# Patient Record
Sex: Male | Born: 1950 | Race: White | Hispanic: No | Smoking: Former smoker
Health system: Southern US, Community
[De-identification: ages and names within clinical notes are randomized; demographics above are authoritative.]

## PROBLEM LIST (undated history)

## (undated) DIAGNOSIS — N189 Chronic kidney disease, unspecified: Secondary | ICD-10-CM

## (undated) DIAGNOSIS — E119 Type 2 diabetes mellitus without complications: Secondary | ICD-10-CM

## (undated) DIAGNOSIS — I714 Abdominal aortic aneurysm, without rupture: Secondary | ICD-10-CM

## (undated) DIAGNOSIS — I10 Essential (primary) hypertension: Secondary | ICD-10-CM

## (undated) DIAGNOSIS — I350 Nonrheumatic aortic (valve) stenosis: Secondary | ICD-10-CM

## (undated) DIAGNOSIS — J45909 Unspecified asthma, uncomplicated: Secondary | ICD-10-CM

---

## 2004-10-15 DIAGNOSIS — I714 Abdominal aortic aneurysm, without rupture, unspecified: Secondary | ICD-10-CM

## 2004-10-15 DIAGNOSIS — I350 Nonrheumatic aortic (valve) stenosis: Secondary | ICD-10-CM

## 2004-10-15 HISTORY — PX: ABDOMINAL AORTIC ANEURYSM REPAIR: SUR1152

## 2004-10-15 HISTORY — DX: Nonrheumatic aortic (valve) stenosis: I35.0

## 2004-10-15 HISTORY — PX: AORTIC VALVE REPLACEMENT: SHX41

## 2004-10-15 HISTORY — DX: Abdominal aortic aneurysm, without rupture: I71.4

## 2004-10-15 HISTORY — DX: Abdominal aortic aneurysm, without rupture, unspecified: I71.40

## 2004-12-11 ENCOUNTER — Emergency Department (HOSPITAL_COMMUNITY): Admission: EM | Admit: 2004-12-11 | Discharge: 2004-12-11 | Payer: Self-pay | Admitting: Emergency Medicine

## 2004-12-21 ENCOUNTER — Ambulatory Visit: Payer: Self-pay | Admitting: *Deleted

## 2004-12-21 ENCOUNTER — Encounter: Payer: Self-pay | Admitting: *Deleted

## 2004-12-21 ENCOUNTER — Ambulatory Visit: Payer: Self-pay | Admitting: Internal Medicine

## 2004-12-21 ENCOUNTER — Emergency Department (HOSPITAL_COMMUNITY): Admission: EM | Admit: 2004-12-21 | Discharge: 2004-12-21 | Payer: Self-pay | Admitting: Emergency Medicine

## 2006-11-06 ENCOUNTER — Ambulatory Visit (HOSPITAL_COMMUNITY): Admission: RE | Admit: 2006-11-06 | Discharge: 2006-11-06 | Payer: Self-pay | Admitting: Orthopaedic Surgery

## 2011-05-02 ENCOUNTER — Encounter: Payer: Self-pay | Admitting: Family Medicine

## 2012-05-26 ENCOUNTER — Ambulatory Visit (HOSPITAL_COMMUNITY)
Admission: RE | Admit: 2012-05-26 | Discharge: 2012-05-26 | Disposition: A | Discharge status: Home / Self Care | Payer: Medicaid Other | Source: Ambulatory Visit | Attending: Family Medicine | Admitting: Family Medicine

## 2012-05-26 ENCOUNTER — Other Ambulatory Visit (HOSPITAL_COMMUNITY): Payer: Self-pay | Admitting: Family Medicine

## 2012-05-26 DIAGNOSIS — R0602 Shortness of breath: Secondary | ICD-10-CM | POA: Insufficient documentation

## 2012-05-26 DIAGNOSIS — F17200 Nicotine dependence, unspecified, uncomplicated: Secondary | ICD-10-CM

## 2012-05-26 DIAGNOSIS — J449 Chronic obstructive pulmonary disease, unspecified: Secondary | ICD-10-CM | POA: Insufficient documentation

## 2012-05-26 DIAGNOSIS — J4489 Other specified chronic obstructive pulmonary disease: Secondary | ICD-10-CM | POA: Insufficient documentation

## 2012-05-26 DIAGNOSIS — R0600 Dyspnea, unspecified: Secondary | ICD-10-CM

## 2014-12-18 ENCOUNTER — Emergency Department (HOSPITAL_COMMUNITY)
Admission: EM | Admit: 2014-12-18 | Discharge: 2014-12-18 | Disposition: A | Discharge status: Home / Self Care | Payer: Medicaid Other | Attending: Emergency Medicine | Admitting: Emergency Medicine

## 2014-12-18 ENCOUNTER — Encounter (HOSPITAL_COMMUNITY): Payer: Self-pay | Admitting: Emergency Medicine

## 2014-12-18 ENCOUNTER — Emergency Department (HOSPITAL_COMMUNITY): Payer: Medicaid Other

## 2014-12-18 DIAGNOSIS — Z880 Allergy status to penicillin: Secondary | ICD-10-CM | POA: Insufficient documentation

## 2014-12-18 DIAGNOSIS — I2510 Atherosclerotic heart disease of native coronary artery without angina pectoris: Secondary | ICD-10-CM | POA: Diagnosis not present

## 2014-12-18 DIAGNOSIS — M25461 Effusion, right knee: Secondary | ICD-10-CM | POA: Insufficient documentation

## 2014-12-18 DIAGNOSIS — Z9889 Other specified postprocedural states: Secondary | ICD-10-CM | POA: Insufficient documentation

## 2014-12-18 DIAGNOSIS — M25561 Pain in right knee: Secondary | ICD-10-CM | POA: Diagnosis not present

## 2014-12-18 DIAGNOSIS — R0602 Shortness of breath: Secondary | ICD-10-CM | POA: Insufficient documentation

## 2014-12-18 DIAGNOSIS — R52 Pain, unspecified: Secondary | ICD-10-CM

## 2014-12-18 DIAGNOSIS — R2241 Localized swelling, mass and lump, right lower limb: Secondary | ICD-10-CM | POA: Diagnosis present

## 2014-12-18 DIAGNOSIS — I10 Essential (primary) hypertension: Secondary | ICD-10-CM | POA: Diagnosis not present

## 2014-12-18 DIAGNOSIS — Z87891 Personal history of nicotine dependence: Secondary | ICD-10-CM | POA: Insufficient documentation

## 2014-12-18 DIAGNOSIS — E119 Type 2 diabetes mellitus without complications: Secondary | ICD-10-CM | POA: Diagnosis not present

## 2014-12-18 HISTORY — DX: Type 2 diabetes mellitus without complications: E11.9

## 2014-12-18 HISTORY — DX: Essential (primary) hypertension: I10

## 2014-12-18 LAB — CBC WITH DIFFERENTIAL/PLATELET
BASOS ABS: 0 10*3/uL (ref 0.0–0.1)
BASOS PCT: 0 % (ref 0–1)
EOS ABS: 0.3 10*3/uL (ref 0.0–0.7)
Eosinophils Relative: 2 % (ref 0–5)
HCT: 40.9 % (ref 39.0–52.0)
HEMOGLOBIN: 14.1 g/dL (ref 13.0–17.0)
Lymphocytes Relative: 23 % (ref 12–46)
Lymphs Abs: 2.7 10*3/uL (ref 0.7–4.0)
MCH: 31 pg (ref 26.0–34.0)
MCHC: 34.5 g/dL (ref 30.0–36.0)
MCV: 89.9 fL (ref 78.0–100.0)
Monocytes Absolute: 0.9 10*3/uL (ref 0.1–1.0)
Monocytes Relative: 7 % (ref 3–12)
NEUTROS ABS: 7.8 10*3/uL — AB (ref 1.7–7.7)
Neutrophils Relative %: 68 % (ref 43–77)
PLATELETS: 151 10*3/uL (ref 150–400)
RBC: 4.55 MIL/uL (ref 4.22–5.81)
RDW: 13.4 % (ref 11.5–15.5)
WBC: 11.7 10*3/uL — AB (ref 4.0–10.5)

## 2014-12-18 LAB — COMPREHENSIVE METABOLIC PANEL
ALBUMIN: 4 g/dL (ref 3.5–5.2)
ALT: 34 U/L (ref 0–53)
ANION GAP: 7 (ref 5–15)
AST: 31 U/L (ref 0–37)
Alkaline Phosphatase: 69 U/L (ref 39–117)
BILIRUBIN TOTAL: 0.4 mg/dL (ref 0.3–1.2)
BUN: 28 mg/dL — ABNORMAL HIGH (ref 6–23)
CO2: 24 mmol/L (ref 19–32)
Calcium: 9.1 mg/dL (ref 8.4–10.5)
Chloride: 108 mmol/L (ref 96–112)
Creatinine, Ser: 1.73 mg/dL — ABNORMAL HIGH (ref 0.50–1.35)
GFR, EST AFRICAN AMERICAN: 47 mL/min — AB (ref >90)
GFR, EST NON AFRICAN AMERICAN: 40 mL/min — AB (ref >90)
Glucose, Bld: 125 mg/dL — ABNORMAL HIGH (ref 70–99)
POTASSIUM: 3.7 mmol/L (ref 3.5–5.1)
SODIUM: 139 mmol/L (ref 135–145)
Total Protein: 6.5 g/dL (ref 6.0–8.3)

## 2014-12-18 MED ORDER — ONDANSETRON HCL 4 MG/2ML IJ SOLN
4.0000 mg | Freq: Once | INTRAMUSCULAR | Status: AC
  Administered 2014-12-18: 4 mg via INTRAVENOUS
  Filled 2014-12-18: qty 2

## 2014-12-18 MED ORDER — OXYCODONE-ACETAMINOPHEN 5-325 MG PO TABS: 1.0000 | ORAL_TABLET | Freq: Four times a day (QID) | ORAL | Status: AC | PRN

## 2014-12-18 MED ORDER — CELECOXIB 100 MG PO CAPS: 100.0000 mg | ORAL_CAPSULE | Freq: Two times a day (BID) | ORAL | Status: DC

## 2014-12-18 MED ORDER — HYDROMORPHONE HCL 1 MG/ML IJ SOLN
1.0000 mg | Freq: Once | INTRAMUSCULAR | Status: AC
  Administered 2014-12-18: 1 mg via INTRAVENOUS
  Filled 2014-12-18: qty 1

## 2014-12-18 NOTE — Discharge Instructions (Signed)
Follow up with dr. Hilda LiasKeeling this week.

## 2014-12-18 NOTE — ED Provider Notes (Signed)
CSN: [id number]     Arrival date & time 12/18/14  1752 History  This chart was scribed for Vincent Lennert, MD by Tanda Rockers, ED Scribe. This patient was seen in room APA08/APA08 and the patient's care was started at 6:20 PM.    Chief Complaint  Patient presents with  . Leg Swelling  . Shortness of Breath    Patient is a 64 y.o. male presenting with shortness of breath and knee pain. The history is provided by the patient. No language interpreter was used.  Shortness of Breath Associated symptoms: no abdominal pain, no chest pain, no cough, no headaches and no rash   Knee Pain Location:  Knee Injury: no   Knee location:  R knee Pain details:    Quality:  Unable to specify   Radiates to:  Does not radiate   Severity:  Moderate   Onset quality:  Sudden   Timing:  Constant   Progression:  Unchanged Chronicity:  New Associated symptoms: no back pain and no fatigue      HPI Comments: [name] is a 64 y.o. male who presents to the Emergency Department complaining of right knee pain that began earlier today. Pt reports that he was eating dinner at [facility] and when he tried to stand up, he was unable to do so due to the pain. Pt states that there is also increased swelling in his right knee. He denies abdominal pain or any other symptoms. Pt does not mention shortness of breath during examination.   Past Medical History  Diagnosis Date  . Coronary artery disease   . Diabetes mellitus without complication   . Hypertension    Past Surgical History  Procedure Laterality Date  . Cardiac surgery     Family History  Problem Relation Age of Onset  . Rheum arthritis Mother    History  Substance Use Topics  . Smoking status: Former Games developer  . Smokeless tobacco: Not on file  . Alcohol Use: 1.2 oz/week    2 Cans of beer per week    Review of Systems  Constitutional: Negative for appetite change and fatigue.  HENT: Negative for congestion, ear discharge and sinus pressure.    Eyes: Negative for discharge.  Respiratory: Negative for cough.   Cardiovascular: Negative for chest pain.  Gastrointestinal: Negative for abdominal pain and diarrhea.  Genitourinary: Negative for frequency and hematuria.  Musculoskeletal: Positive for arthralgias (Right knee pain. ). Negative for back pain.  Skin: Negative for rash.  Neurological: Negative for seizures and headaches.  Psychiatric/Behavioral: Negative for hallucinations.      Allergies  Penicillins  Home Medications   Prior to Admission medications   Not on File   Triage Vitals: BP 189/102 mmHg  Pulse 90  Temp(Src) 97.9 F (36.6 C) (Oral)  Resp 16  Ht  (1.854 m)  Wt 280 lb (127.007 kg)  BMI 36.95 kg/m2  SpO2 100%   Physical Exam  Constitutional: He is oriented to person, place, and time. He appears well-developed.  HENT:  Head: Normocephalic.  Eyes: Conjunctivae and EOM are normal. No scleral icterus.  Neck: Neck supple. No thyromegaly present.  Cardiovascular: Normal rate and regular rhythm.  Exam reveals no gallop and no friction rub.   No murmur heard. Pulmonary/Chest: No stridor. He has no wheezes. He has no rales. He exhibits no tenderness.  Abdominal: He exhibits no distension. There is no tenderness. There is no rebound.  Musculoskeletal: Normal range of motion. He exhibits tenderness (  Right lateral knee. ). He exhibits no edema.  Lymphadenopathy:    He has no cervical adenopathy.  Neurological: He is oriented to person, place, and time. He exhibits normal muscle tone. Coordination normal.  Skin: No rash noted. No erythema.  Psychiatric: He has a normal mood and affect. His behavior is normal.    ED Course  Procedures (including critical care time)  DIAGNOSTIC STUDIES: Oxygen Saturation is 100% on RA, normal by my interpretation.    COORDINATION OF CARE: 6:26 PM-Discussed treatment plan which includes Right knee X Ray with pt at bedside and pt agreed to plan.   Labs Review Labs  Reviewed  CBC WITH DIFFERENTIAL/PLATELET  COMPREHENSIVE METABOLIC PANEL    Imaging Review No results found.   EKG Interpretation None      MDM   Final diagnoses:  Pain   Knee pain from osteoarthritis.   tx with celebrex and percocet  hospital     Vincent LennertJoseph L Natanel Snavely, MD 12/18/14 2004

## 2014-12-18 NOTE — ED Notes (Signed)
Notice swelling to right leg and SOB today while out eating. .  Having pain right leg, rates 10.  Have not taken any medication today.  History of open heart 2006.

## 2014-12-19 NOTE — ED Provider Notes (Signed)
10:28 AM  Pharmacy called.  Pt's insurance does not cover Celebrex which she was discharged home with yesterday. Given prescription for Mobitz 7.5 mg take twice daily #14 with 0 refills.  Layla MawKristen N Floree Zuniga, DO 12/19/14 1029

## 2015-05-03 ENCOUNTER — Emergency Department (HOSPITAL_COMMUNITY): Payer: Medicaid Other

## 2015-05-03 ENCOUNTER — Encounter (HOSPITAL_COMMUNITY): Payer: Self-pay | Admitting: Emergency Medicine

## 2015-05-03 ENCOUNTER — Inpatient Hospital Stay (HOSPITAL_COMMUNITY)
Admission: EM | Admit: 2015-05-03 | Discharge: 2015-05-14 | DRG: 189 | Disposition: A | Discharge status: Home / Self Care | Payer: Medicaid Other | Attending: Family Medicine | Admitting: Family Medicine

## 2015-05-03 DIAGNOSIS — J9621 Acute and chronic respiratory failure with hypoxia: Principal | ICD-10-CM | POA: Diagnosis present

## 2015-05-03 DIAGNOSIS — N189 Chronic kidney disease, unspecified: Secondary | ICD-10-CM | POA: Diagnosis present

## 2015-05-03 DIAGNOSIS — I10 Essential (primary) hypertension: Secondary | ICD-10-CM

## 2015-05-03 DIAGNOSIS — Z794 Long term (current) use of insulin: Secondary | ICD-10-CM

## 2015-05-03 DIAGNOSIS — Z7982 Long term (current) use of aspirin: Secondary | ICD-10-CM

## 2015-05-03 DIAGNOSIS — Z6841 Body Mass Index (BMI) 40.0 and over, adult: Secondary | ICD-10-CM

## 2015-05-03 DIAGNOSIS — R079 Chest pain, unspecified: Secondary | ICD-10-CM | POA: Diagnosis present

## 2015-05-03 DIAGNOSIS — I129 Hypertensive chronic kidney disease with stage 1 through stage 4 chronic kidney disease, or unspecified chronic kidney disease: Secondary | ICD-10-CM | POA: Diagnosis present

## 2015-05-03 DIAGNOSIS — E119 Type 2 diabetes mellitus without complications: Secondary | ICD-10-CM

## 2015-05-03 DIAGNOSIS — I2510 Atherosclerotic heart disease of native coronary artery without angina pectoris: Secondary | ICD-10-CM | POA: Diagnosis present

## 2015-05-03 DIAGNOSIS — R0600 Dyspnea, unspecified: Secondary | ICD-10-CM | POA: Diagnosis not present

## 2015-05-03 DIAGNOSIS — N182 Chronic kidney disease, stage 2 (mild): Secondary | ICD-10-CM | POA: Diagnosis present

## 2015-05-03 DIAGNOSIS — J449 Chronic obstructive pulmonary disease, unspecified: Secondary | ICD-10-CM | POA: Diagnosis present

## 2015-05-03 DIAGNOSIS — J9601 Acute respiratory failure with hypoxia: Secondary | ICD-10-CM | POA: Diagnosis present

## 2015-05-03 DIAGNOSIS — R0602 Shortness of breath: Secondary | ICD-10-CM

## 2015-05-03 DIAGNOSIS — E669 Obesity, unspecified: Secondary | ICD-10-CM | POA: Diagnosis present

## 2015-05-03 DIAGNOSIS — Z87891 Personal history of nicotine dependence: Secondary | ICD-10-CM | POA: Diagnosis not present

## 2015-05-03 DIAGNOSIS — T380X5A Adverse effect of glucocorticoids and synthetic analogues, initial encounter: Secondary | ICD-10-CM | POA: Diagnosis not present

## 2015-05-03 DIAGNOSIS — Z951 Presence of aortocoronary bypass graft: Secondary | ICD-10-CM | POA: Diagnosis not present

## 2015-05-03 DIAGNOSIS — R000 Tachycardia, unspecified: Secondary | ICD-10-CM | POA: Diagnosis present

## 2015-05-03 DIAGNOSIS — Z952 Presence of prosthetic heart valve: Secondary | ICD-10-CM | POA: Diagnosis not present

## 2015-05-03 DIAGNOSIS — I4892 Unspecified atrial flutter: Secondary | ICD-10-CM | POA: Diagnosis not present

## 2015-05-03 DIAGNOSIS — I483 Typical atrial flutter: Secondary | ICD-10-CM | POA: Diagnosis not present

## 2015-05-03 DIAGNOSIS — J441 Chronic obstructive pulmonary disease with (acute) exacerbation: Secondary | ICD-10-CM

## 2015-05-03 DIAGNOSIS — E1122 Type 2 diabetes mellitus with diabetic chronic kidney disease: Secondary | ICD-10-CM | POA: Diagnosis present

## 2015-05-03 DIAGNOSIS — J9600 Acute respiratory failure, unspecified whether with hypoxia or hypercapnia: Secondary | ICD-10-CM | POA: Diagnosis present

## 2015-05-03 HISTORY — DX: Abdominal aortic aneurysm, without rupture: I71.4

## 2015-05-03 HISTORY — DX: Unspecified asthma, uncomplicated: J45.909

## 2015-05-03 HISTORY — DX: Nonrheumatic aortic (valve) stenosis: I35.0

## 2015-05-03 HISTORY — DX: Chronic kidney disease, unspecified: N18.9

## 2015-05-03 LAB — COMPREHENSIVE METABOLIC PANEL
ALBUMIN: 3.9 g/dL (ref 3.5–5.0)
ALT: 35 U/L (ref 17–63)
AST: 44 U/L — AB (ref 15–41)
Alkaline Phosphatase: 63 U/L (ref 38–126)
Anion gap: 10 (ref 5–15)
BUN: 31 mg/dL — ABNORMAL HIGH (ref 6–20)
CO2: 25 mmol/L (ref 22–32)
Calcium: 9 mg/dL (ref 8.9–10.3)
Chloride: 108 mmol/L (ref 101–111)
Creatinine, Ser: 1.77 mg/dL — ABNORMAL HIGH (ref 0.61–1.24)
GFR calc non Af Amer: 39 mL/min — ABNORMAL LOW (ref >60)
GFR, EST AFRICAN AMERICAN: 45 mL/min — AB (ref >60)
GLUCOSE: 250 mg/dL — AB (ref 65–99)
Potassium: 4.5 mmol/L (ref 3.5–5.1)
Sodium: 143 mmol/L (ref 135–145)
Total Bilirubin: 0.3 mg/dL (ref 0.3–1.2)
Total Protein: 6.4 g/dL — ABNORMAL LOW (ref 6.5–8.1)

## 2015-05-03 LAB — CBC WITH DIFFERENTIAL/PLATELET
Basophils Absolute: 0 10*3/uL (ref 0.0–0.1)
Basophils Relative: 0 % (ref 0–1)
EOS ABS: 0.1 10*3/uL (ref 0.0–0.7)
Eosinophils Relative: 1 % (ref 0–5)
HEMATOCRIT: 42.2 % (ref 39.0–52.0)
HEMOGLOBIN: 14.2 g/dL (ref 13.0–17.0)
Lymphocytes Relative: 18 % (ref 12–46)
Lymphs Abs: 2.2 10*3/uL (ref 0.7–4.0)
MCH: 30.6 pg (ref 26.0–34.0)
MCHC: 33.6 g/dL (ref 30.0–36.0)
MCV: 90.9 fL (ref 78.0–100.0)
MONOS PCT: 6 % (ref 3–12)
Monocytes Absolute: 0.7 10*3/uL (ref 0.1–1.0)
Neutro Abs: 8.8 10*3/uL — ABNORMAL HIGH (ref 1.7–7.7)
Neutrophils Relative %: 75 % (ref 43–77)
PLATELETS: 154 10*3/uL (ref 150–400)
RBC: 4.64 MIL/uL (ref 4.22–5.81)
RDW: 13.7 % (ref 11.5–15.5)
WBC: 11.8 10*3/uL — ABNORMAL HIGH (ref 4.0–10.5)

## 2015-05-03 LAB — TSH: TSH: 1.701 u[IU]/mL (ref 0.350–4.500)

## 2015-05-03 LAB — I-STAT TROPONIN, ED: Troponin i, poc: 0.04 ng/mL (ref 0.00–0.08)

## 2015-05-03 LAB — GLUCOSE, CAPILLARY: Glucose-Capillary: 229 mg/dL — ABNORMAL HIGH (ref 65–99)

## 2015-05-03 LAB — BRAIN NATRIURETIC PEPTIDE: B Natriuretic Peptide: 220 pg/mL — ABNORMAL HIGH (ref 0.0–100.0)

## 2015-05-03 LAB — TROPONIN I: TROPONIN I: 0.05 ng/mL — AB (ref <0.031)

## 2015-05-03 MED ORDER — LEVOFLOXACIN IN D5W 500 MG/100ML IV SOLN
500.0000 mg | INTRAVENOUS | Status: DC
  Administered 2015-05-04: 500 mg via INTRAVENOUS
  Filled 2015-05-03: qty 100

## 2015-05-03 MED ORDER — SODIUM CHLORIDE 0.9 % IJ SOLN
3.0000 mL | INTRAMUSCULAR | Status: DC | PRN
  Administered 2015-05-08: 3 mL via INTRAVENOUS
  Filled 2015-05-03: qty 3

## 2015-05-03 MED ORDER — METHYLPREDNISOLONE SODIUM SUCC 125 MG IJ SOLR
125.0000 mg | Freq: Once | INTRAMUSCULAR | Status: AC
  Administered 2015-05-03: 125 mg via INTRAVENOUS
  Filled 2015-05-03: qty 2

## 2015-05-03 MED ORDER — INSULIN ASPART 100 UNIT/ML ~~LOC~~ SOLN
0.0000 [IU] | Freq: Three times a day (TID) | SUBCUTANEOUS | Status: DC
Start: 2015-05-03 — End: 2015-05-05
  Administered 2015-05-03: 5 [IU] via SUBCUTANEOUS
  Administered 2015-05-04 (×2): 11 [IU] via SUBCUTANEOUS
  Administered 2015-05-04: 15 [IU] via SUBCUTANEOUS
  Administered 2015-05-05: 8 [IU] via SUBCUTANEOUS

## 2015-05-03 MED ORDER — ACETAMINOPHEN 325 MG PO TABS: 650.0000 mg | ORAL_TABLET | Freq: Four times a day (QID) | ORAL | Status: DC | PRN

## 2015-05-03 MED ORDER — ASPIRIN EC 325 MG PO TBEC
325.0000 mg | DELAYED_RELEASE_TABLET | Freq: Every day | ORAL | Status: DC
  Administered 2015-05-03 – 2015-05-10 (×8): 325 mg via ORAL
  Filled 2015-05-03 (×8): qty 1

## 2015-05-03 MED ORDER — SORBITOL 70 % SOLN: 30.0000 mL | Freq: Every day | Status: DC | PRN

## 2015-05-03 MED ORDER — LEVOFLOXACIN IN D5W 500 MG/100ML IV SOLN
500.0000 mg | Freq: Once | INTRAVENOUS | Status: AC
  Administered 2015-05-03: 500 mg via INTRAVENOUS
  Filled 2015-05-03: qty 100

## 2015-05-03 MED ORDER — ACETAMINOPHEN 650 MG RE SUPP: 650.0000 mg | Freq: Four times a day (QID) | RECTAL | Status: DC | PRN

## 2015-05-03 MED ORDER — MORPHINE SULFATE 4 MG/ML IJ SOLN
4.0000 mg | Freq: Once | INTRAMUSCULAR | Status: AC
  Administered 2015-05-03: 4 mg via INTRAVENOUS
  Filled 2015-05-03: qty 1

## 2015-05-03 MED ORDER — METHYLPREDNISOLONE SODIUM SUCC 125 MG IJ SOLR
60.0000 mg | Freq: Four times a day (QID) | INTRAMUSCULAR | Status: DC
  Administered 2015-05-03 – 2015-05-08 (×18): 60 mg via INTRAVENOUS
  Filled 2015-05-03 (×18): qty 2

## 2015-05-03 MED ORDER — SIMVASTATIN 20 MG PO TABS
40.0000 mg | ORAL_TABLET | Freq: Every day | ORAL | Status: DC
  Administered 2015-05-03 – 2015-05-10 (×8): 40 mg via ORAL
  Filled 2015-05-03 (×8): qty 2

## 2015-05-03 MED ORDER — HEPARIN SODIUM (PORCINE) 5000 UNIT/ML IJ SOLN
5000.0000 [IU] | Freq: Three times a day (TID) | INTRAMUSCULAR | Status: DC
  Administered 2015-05-03 – 2015-05-12 (×27): 5000 [IU] via SUBCUTANEOUS
  Filled 2015-05-03 (×27): qty 1

## 2015-05-03 MED ORDER — METOPROLOL TARTRATE 1 MG/ML IV SOLN: 2.5000 mg | Freq: Once | INTRAVENOUS | Status: DC

## 2015-05-03 MED ORDER — ALUM & MAG HYDROXIDE-SIMETH 200-200-20 MG/5ML PO SUSP
30.0000 mL | Freq: Four times a day (QID) | ORAL | Status: DC | PRN
  Administered 2015-05-04: 30 mL via ORAL
  Filled 2015-05-03: qty 30

## 2015-05-03 MED ORDER — ONDANSETRON HCL 4 MG PO TABS: 4.0000 mg | ORAL_TABLET | Freq: Four times a day (QID) | ORAL | Status: DC | PRN

## 2015-05-03 MED ORDER — LEVALBUTEROL HCL 1.25 MG/0.5ML IN NEBU
1.2500 mg | INHALATION_SOLUTION | Freq: Once | RESPIRATORY_TRACT | Status: AC
Start: 2015-05-03 — End: 2015-05-03
  Administered 2015-05-03: 1.25 mg via RESPIRATORY_TRACT
  Filled 2015-05-03: qty 0.5

## 2015-05-03 MED ORDER — SODIUM CHLORIDE 0.9 % IJ SOLN
3.0000 mL | Freq: Two times a day (BID) | INTRAMUSCULAR | Status: DC
  Administered 2015-05-05 – 2015-05-13 (×17): 3 mL via INTRAVENOUS

## 2015-05-03 MED ORDER — SODIUM CHLORIDE 0.9 % IV SOLN: 250.0000 mL | INTRAVENOUS | Status: DC | PRN

## 2015-05-03 MED ORDER — LEVALBUTEROL HCL 0.63 MG/3ML IN NEBU
0.6300 mg | INHALATION_SOLUTION | Freq: Four times a day (QID) | RESPIRATORY_TRACT | Status: DC
  Administered 2015-05-03 – 2015-05-04 (×5): 0.63 mg via RESPIRATORY_TRACT
  Filled 2015-05-03 (×5): qty 3

## 2015-05-03 MED ORDER — HYDROCODONE-ACETAMINOPHEN 7.5-325 MG PO TABS
1.0000 | ORAL_TABLET | ORAL | Status: DC | PRN
  Administered 2015-05-06: 1 via ORAL
  Filled 2015-05-03: qty 1

## 2015-05-03 MED ORDER — INSULIN GLARGINE 100 UNIT/ML ~~LOC~~ SOLN
15.0000 [IU] | Freq: Two times a day (BID) | SUBCUTANEOUS | Status: DC
  Administered 2015-05-03 – 2015-05-04 (×3): 15 [IU] via SUBCUTANEOUS
  Filled 2015-05-03 (×6): qty 0.15

## 2015-05-03 MED ORDER — MAGNESIUM HYDROXIDE 400 MG/5ML PO SUSP: 30.0000 mL | Freq: Every day | ORAL | Status: DC | PRN

## 2015-05-03 MED ORDER — SODIUM CHLORIDE 0.9 % IJ SOLN
3.0000 mL | Freq: Two times a day (BID) | INTRAMUSCULAR | Status: DC
  Administered 2015-05-04 – 2015-05-06 (×3): 3 mL via INTRAVENOUS

## 2015-05-03 MED ORDER — GUAIFENESIN-DM 100-10 MG/5ML PO SYRP
5.0000 mL | ORAL_SOLUTION | ORAL | Status: DC | PRN
  Administered 2015-05-04 – 2015-05-07 (×4): 5 mL via ORAL
  Filled 2015-05-03 (×4): qty 5

## 2015-05-03 MED ORDER — ONDANSETRON HCL 4 MG/2ML IJ SOLN: 4.0000 mg | Freq: Four times a day (QID) | INTRAMUSCULAR | Status: DC | PRN

## 2015-05-03 MED ORDER — INSULIN ASPART 100 UNIT/ML ~~LOC~~ SOLN
0.0000 [IU] | Freq: Every day | SUBCUTANEOUS | Status: DC
  Administered 2015-05-03: 5 [IU] via SUBCUTANEOUS
  Administered 2015-05-04: 3 [IU] via SUBCUTANEOUS

## 2015-05-03 MED ORDER — CETYLPYRIDINIUM CHLORIDE 0.05 % MT LIQD
7.0000 mL | Freq: Two times a day (BID) | OROMUCOSAL | Status: DC
Start: 2015-05-03 — End: 2015-05-10
  Administered 2015-05-04 – 2015-05-09 (×12): 7 mL via OROMUCOSAL

## 2015-05-03 MED ORDER — NITROGLYCERIN 2 % TD OINT
1.0000 [in_us] | TOPICAL_OINTMENT | Freq: Once | TRANSDERMAL | Status: AC
  Administered 2015-05-03: 1 [in_us] via TOPICAL
  Filled 2015-05-03: qty 1

## 2015-05-03 MED ORDER — BUDESONIDE 0.25 MG/2ML IN SUSP
0.2500 mg | Freq: Two times a day (BID) | RESPIRATORY_TRACT | Status: DC
  Administered 2015-05-04 – 2015-05-05 (×3): 0.25 mg via RESPIRATORY_TRACT
  Filled 2015-05-03 (×6): qty 2

## 2015-05-03 NOTE — H&P (Signed)
[facility] History and Physical  Vincent PickerelWilliam S Haskett WGN:[id number] DOB: Jun 13, 1951 DOA: 05/03/2015  Referring physician: zammit PCP: Alice ReichertMCINNIS,ANGUS G, MD   Chief Complaint: sob/chest pain  HPI: Vincent PickerelWilliam S Kaufman is a 64 y.o. male past medical hx COPD, CAD s/p CABG 2006, diabetes, hypertension, chronic kidney disease presents to the emergency department with the chief complaint of persistent worsening shortness of breath and commitment left anterior chest pain. Initial evaluation in the emergency department reveals acute respiratory failure in the setting of a COPD exacerbation.  Information is obtained from the patient reports 3-4 day history of upper respiratory infection symptoms this is likely rhinorrhea, sore throat, head congestion intermittent cough. Associated symptoms include worsening shortness of breath with exertion and this morning he developed left anterior chest "tightness". Given his history of CAD death when he decided to come to the emergency department. Describes the pain as a tight pressure located left anterior chest worse with coughing. He denies orthopnea or lower extremity edema headache dizziness syncope or near-syncope. He denies fever chills recent travel or recent sick contacts. He does endorse some nausea without vomiting and all about wheezing. He denies abdominal pain diarrhea constipation dysuria hematuria frequency or urgency. Workup in the emergency department reveals a creatinine of 1.77 serum glucose 250 AST 44, BNP 220 initial troponin 0.04 mild leukocytosis of 11.8. Chest x-ray with no active cardiopulmonary disease. EKG Ectopic atrial tachycardia, unifocal Ventricular premature complex Left anterior fascicular block Anteroseptal infarct.  In the emergency department he is hypertensive tachycardic afebrile and hypoxic. He is provided with Solu-Medrol morphine and nitroglycerin paste Xopenex nebulizer and Levaquin. The time of my exam his respiratory effort  is less he sat 90% on 2 L nasal cannula   Review of Systems:     Past Medical History  Diagnosis Date  . Coronary artery disease     s/p cabg 2006  . Diabetes mellitus without complication   . Hypertension   . Asthma   . CKD (chronic kidney disease)    Past Surgical History  Procedure Laterality Date  . Cardiac surgery     Social History:  reports that he has quit smoking. He does not have any smokeless tobacco history on file. He reports that he does not drink alcohol or use illicit drugs. 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 Penicillins Rash    Family History  Problem Relation Age of Onset  . Rheum arthritis Mother     Prior to Admission medications   Medication Sig Start Date End Date Taking? Authorizing Provider  aspirin EC 81 MG tablet Take 81 mg by mouth daily.   Yes Historical Provider, MD  HYDROcodone-acetaminophen (NORCO) 7.5-325 MG per tablet Take 1 tablet by mouth every 4 (four) hours as needed for moderate pain or severe pain.  12/10/14  Yes Historical Provider, MD  insulin aspart (NOVOLOG) 100 UNIT/ML FlexPen Inject 20 Units into the skin 3 (three) times daily with meals. Sliding scale   Yes Historical Provider, MD  insulin glargine (LANTUS) 100 unit/mL SOPN Inject 20-28 Units into the skin 2 (two) times daily. 28 units in the morning and 20 units before bed   Yes Historical Provider, MD  lisinopril (PRINIVIL,ZESTRIL) 10 MG tablet Take 10 mg by mouth daily.   Yes Historical Provider, MD  simvastatin (ZOCOR) 40 MG tablet Take 40 mg by mouth daily. 11/15/14  Yes Historical Provider, MD  oxyCODONE-acetaminophen (PERCOCET/ROXICET) 5-325 MG per tablet Take 1 tablet by mouth every 6 (six) hours as needed. 12/18/14   Bethann Berkshire, MD   Physical Exam: Filed Vitals:   05/03/15 1300 05/03/15 1330 05/03/15 1400 05/03/15 1431  BP: 128/88 128/95 110/62   Pulse: 120 120 88     Temp:      TempSrc:      Resp: 23 24 19    Height:      Weight:      SpO2: 94% 95% 94% 94%    Wt Readings from Last 3 Encounters:  05/03/15 122.471 kg (270 lb)  12/18/14 127.007 kg (280 lb)    General:  Appears calm and comfortable, obese Eyes: PERRL, normal lids, irises & conjunctiva ENT: grossly normal hearing, lips & tongue his membranes of his mouth are pink slightly dry Neck: no LAD, masses or thyromegaly Cardiovascular: Tachycardic but regular, no m/r/g. Trace LE edema. Respiratory: Mild increased work of breathing with conversation. Breath sounds diminished throughout. Faint expiratory wheezing. Abdomen: soft, ntnd positive bowel sounds no guarding Skin: no rash or induration seen on limited exam Musculoskeletal: grossly normal tone BUE/BLE Psychiatric: grossly normal mood and affect, speech fluent and appropriate Neurologic: grossly non-focal. Speech clear facial symmetry           Labs on Admission:  Basic Metabolic Panel:  Recent Labs Lab 05/03/15 1203  NA 143  K 4.5  CL 108  CO2 25  GLUCOSE 250*  BUN 31*  CREATININE 1.77*  CALCIUM 9.0   Liver Function Tests:  Recent Labs Lab 05/03/15 1203  AST 44*  ALT 35  ALKPHOS 63  BILITOT 0.3  PROT 6.4*  ALBUMIN 3.9   No results for input(s): LIPASE, AMYLASE in the last 168 hours. No results for input(s): AMMONIA in the last 168 hours. CBC:  Recent Labs Lab 05/03/15 1203  WBC 11.8*  NEUTROABS 8.8*  HGB 14.2  HCT 42.2  MCV 90.9  PLT 154   Cardiac Enzymes: No results for input(s): CKTOTAL, CKMB, CKMBINDEX, TROPONINI in the last 168 hours.  BNP (last 3 results)  Recent Labs  05/03/15 1203  BNP 220.0*    ProBNP (last 3 results) No results for input(s): PROBNP in the last 8760 hours.  CBG: No results for input(s): GLUCAP in the last 168 hours.  Radiological Exams on Admission: Dg Chest Port 1 View  05/03/2015   CLINICAL DATA:  Shortness of Breath  EXAM: PORTABLE CHEST - 1 VIEW   COMPARISON:  05/26/2012  FINDINGS: Borderline cardiomegaly. Status post median sternotomy. No acute infiltrate or pleural effusion. No pulmonary edema.  IMPRESSION: No active disease. Borderline cardiomegaly. Status post median sternotomy.   Electronically Signed   By: Natasha Mead M.D.   On: 05/03/2015 12:22    EKG:   Assessment/Plan Principal Problem:  Acute respiratory failure with hypoxia:  - patient reports a history of asthma rather than COPD, but has a history of smoking. Currently this may be COPD with exacerbation. Admit to telemetry. Continue nebulizers Solu-Medrol Levaquin. Continue oxygen supplementation and wean as able. - will likely need PFTs once acute issue resolved - add pulmicort  Active Problems: Chest pain: Likely musco skeletal related to cough as pain is worse with coughing. He does have a significant cardiac history and he does have some lower extremity edema so therefore willl cycle enzymes, obtain a 2-D echo and monitor on telemetry.  He reports at the time of my exam pain subsided.  Sinus tachycardia: Likely related to above.  Medications include lisinopril which I will hold for now. will give low-dose beta blocker as his blood pressure a little soft since he got the nitroglycerin ointment in the emergency department. Monitor closely  Hypertension: Arrival to the emergency department blood pressure 150/108 with a heart rate of 119. He was given Nitropaste with good results. Home medications include lisinopril which I will hold for now given his creatinine of 1.7. Use low-dose beta blocker as able and when necessary hydralazine  COPD with exacerbation: See #1. Continue nebs Solu-Medrol and Levaquin. Add flutter valve as indicated.  Diabetes mellitus without complication: Obtain a hemoglobin A1c. We'll provide Lantus at a slightly lower dose than his home dose as his appetite has been unreliable the last couple of days.  CKD (chronic kidney disease): Level II. Chart  review indicates baseline creatinine 1.7. This is his current creatinine level. I'm still get a hold his lisinopril for now.  Coronary artery disease:        Code Status: full DVT Prophylaxis: Family Communication: none present Disposition Plan: home when ready Time spent: 65 minutes  Gwenyth Bender [facility] Pager [phone]   Patient was seen, examined,treatment plan was discussed with the Advance Practice Provider.  I have directly reviewed the clinical findings, lab, imaging studies and management of this patient in detail. I have made the necessary changes to the above noted documentation, and agree with the documentation, as recorded by the Advance Practice Provider.    Pamella Pert, MD [facility] [phone]

## 2015-05-03 NOTE — ED Notes (Signed)
Started with coughing 2 days ago - increasing SOB - White phlegm  Pt denies increase in any swelling . Cant lie down due to sob

## 2015-05-03 NOTE — Progress Notes (Signed)
ANTIBIOTIC CONSULT NOTE - INITIAL  Pharmacy Consult for Levaquin Indication: COPD  Allergies  Allergen Reactions  . Penicillins Rash    Patient Measurements: Height: 6\' 1"  (185.4 cm) Weight: 270 lb (122.471 kg) IBW/kg (Calculated) : 79.9 Adjusted Body Weight:   Vital Signs: Temp: 98.1 F (36.7 C) (07/19 1125) Temp Source: Oral (07/19 1125) BP: 110/62 mmHg (07/19 1400) Pulse Rate: 88 (07/19 1400) Intake/Output from previous day:   Intake/Output from this shift:    Labs:  Recent Labs  05/03/15 1203  WBC 11.8*  HGB 14.2  PLT 154  CREATININE 1.77*   Estimated Creatinine Clearance: 58.5 mL/min (by C-G formula based on Cr of 1.77). No results for input(s): VANCOTROUGH, VANCOPEAK, VANCORANDOM, GENTTROUGH, GENTPEAK, GENTRANDOM, TOBRATROUGH, TOBRAPEAK, TOBRARND, AMIKACINPEAK, AMIKACINTROU, AMIKACIN in the last 72 hours.   Microbiology: No results found for this or any previous visit (from the past 720 hour(s)).  Medical History: Past Medical History  Diagnosis Date  . Coronary artery disease     s/p cabg 2006  . Diabetes mellitus without complication   . Hypertension   . Asthma   . CKD (chronic kidney disease)     Medications:   (Not in a hospital admission) Assessment: Acute respiratory failure with hypoxia: Secondary to COPD with exacerbation. Levaquin 500 mg IV given in ED  Goal of Therapy:  Eradicate infection  Plan:  Levaquin 500 mg IV every 24 hours starting tomorrow Monitor renal function F/U oral therapy when appropriate  Josephine Igoittman, Calysta Craigo Bennett 05/03/2015,3:09 PM

## 2015-05-03 NOTE — ED Provider Notes (Signed)
CSN: [id number]     Arrival date & time 05/03/15  1118 History  This chart was scribed for Bethann Berkshire, MD by Marica Otter, ED Scribe. This patient was seen in room APA06/APA06 and the patient's care was started at 11:47 AM.   Chief Complaint  Patient presents with  . Shortness of Breath   Patient is a 64 y.o. male presenting with shortness of breath. The history is provided by the patient. No language interpreter was used.  Shortness of Breath Severity:  Moderate Onset quality:  Gradual Duration:  2 days Timing:  Intermittent Progression:  Worsening Chronicity:  New Associated symptoms: chest pain and cough   Associated symptoms: no abdominal pain, no fever, no headaches and no rash   Chest pain:    Chest pain quality: tightness.   Severity:  Severe   Duration:  2 days   Timing:  Intermittent   Progression:  Worsening Cough:    Cough characteristics:  Productive   Sputum characteristics:  White   Severity:  Moderate   Duration:  2 days   Timing:  Intermittent   Chronicity:  New  PCP: Alice Reichert, MD HPI Comments: Vincent Kaufman is a 64 y.o. male, with PMHx noted below including open heart surgery (2006), CAD, HTN, DM and asthma, who presents to the Emergency Department complaining of worsening SOB with associated worsening generalized chest pain onset 2 days ago. Pt states the SOB is exasperated when lying down.   Past Medical History  Diagnosis Date  . Coronary artery disease   . Diabetes mellitus without complication   . Hypertension   . Asthma    Past Surgical History  Procedure Laterality Date  . Cardiac surgery     Family History  Problem Relation Age of Onset  . Rheum arthritis Mother    History  Substance Use Topics  . Smoking status: Former Games developer  . Smokeless tobacco: Not on file  . Alcohol Use: No    Review of Systems  Constitutional: Negative for fever, appetite change and fatigue.  HENT: Negative for congestion, ear discharge and sinus  pressure.   Eyes: Negative for discharge.  Respiratory: Positive for cough, chest tightness and shortness of breath.   Cardiovascular: Positive for chest pain.  Gastrointestinal: Negative for abdominal pain and diarrhea.  Genitourinary: Negative for frequency and hematuria.  Musculoskeletal: Negative for back pain.  Skin: Negative for rash.  Neurological: Negative for seizures and headaches.  Psychiatric/Behavioral: Negative for hallucinations.   Allergies  Penicillins  Home Medications   Prior to Admission medications   Medication Sig Start Date End Date Taking? Authorizing Provider  aspirin EC 81 MG tablet Take 81 mg by mouth daily.    Historical Provider, MD  celecoxib (CELEBREX) 100 MG capsule Take 1 capsule (100 mg total) by mouth 2 (two) times daily. 12/18/14   Bethann Berkshire, MD  HYDROcodone-acetaminophen (NORCO) 7.5-325 MG per tablet Take 1 tablet by mouth every 4 (four) hours as needed for moderate pain or severe pain.  12/10/14   Historical Provider, MD  oxyCODONE-acetaminophen (PERCOCET/ROXICET) 5-325 MG per tablet Take 1 tablet by mouth every 6 (six) hours as needed. 12/18/14   Bethann Berkshire, MD  simvastatin (ZOCOR) 40 MG tablet Take 40 mg by mouth daily. 11/15/14   Historical Provider, MD  UNKNOWN TO PATIENT Inject 20 Units into the skin 4 (four) times daily.    Historical Provider, MD  UNKNOWN TO PATIENT Inject 28 Units into the skin 2 (two) times daily.  Historical Provider, MD  UNKNOWN TO PATIENT Take 1 tablet by mouth daily.    Historical Provider, MD   Triage Vitals: BP 139/94 mmHg  Pulse 119  Temp(Src) 98.1 F (36.7 C) (Oral)  Resp 24  Ht 6\' 1"  (1.854 m)  Wt 270 lb (122.471 kg)  BMI 35.63 kg/m2  SpO2 94% Physical Exam  Constitutional: He is oriented to person, place, and time. He appears well-developed.  HENT:  Head: Normocephalic.  Eyes: Conjunctivae and EOM are normal. No scleral icterus.  Neck: Neck supple. No thyromegaly present.  Cardiovascular: Regular  rhythm.  Tachycardia present.  Exam reveals no gallop and no friction rub.   No murmur heard. Pulmonary/Chest: No stridor. He has wheezes (mild wheezing bilaterally). He has no rales. He exhibits no tenderness.  Abdominal: He exhibits no distension. There is no tenderness. There is no rebound.  Musculoskeletal: Normal range of motion. He exhibits edema (1+ pitting edema bilateral ankles).  Lymphadenopathy:    He has no cervical adenopathy.  Neurological: He is oriented to person, place, and time. He exhibits normal muscle tone. Coordination normal.  Skin: No rash noted. No erythema.  Psychiatric: He has a normal mood and affect. His behavior is normal.    ED Course  Procedures (including critical care time) DIAGNOSTIC STUDIES: Oxygen Saturation is 94% on RA, adequate by my interpretation.    COORDINATION OF CARE: 11:49 AM-Discussed treatment plan with pt at bedside and pt agreed to plan.   Labs Review Labs Reviewed - No data to display  Imaging Review No results found.   EKG Interpretation None      MDM   Final diagnoses:  None   Copd exacerbation,  Cad.  Admit  The chart was scribed for me under my direct supervision.  I personally performed the history, physical, and medical decision making and all procedures in the evaluation of this patient.Bethann Berkshire.    Asucena Galer, MD 05/03/15 [phone]

## 2015-05-04 ENCOUNTER — Encounter (HOSPITAL_COMMUNITY): Payer: Self-pay | Admitting: *Deleted

## 2015-05-04 ENCOUNTER — Inpatient Hospital Stay (HOSPITAL_COMMUNITY): Payer: Medicaid Other

## 2015-05-04 DIAGNOSIS — R0600 Dyspnea, unspecified: Secondary | ICD-10-CM

## 2015-05-04 LAB — BASIC METABOLIC PANEL
ANION GAP: 11 (ref 5–15)
BUN: 35 mg/dL — AB (ref 6–20)
CALCIUM: 9 mg/dL (ref 8.9–10.3)
CHLORIDE: 105 mmol/L (ref 101–111)
CO2: 21 mmol/L — AB (ref 22–32)
Creatinine, Ser: 1.67 mg/dL — ABNORMAL HIGH (ref 0.61–1.24)
GFR calc non Af Amer: 42 mL/min — ABNORMAL LOW (ref >60)
GFR, EST AFRICAN AMERICAN: 49 mL/min — AB (ref >60)
Glucose, Bld: 363 mg/dL — ABNORMAL HIGH (ref 65–99)
Potassium: 4.1 mmol/L (ref 3.5–5.1)
Sodium: 137 mmol/L (ref 135–145)

## 2015-05-04 LAB — CBC
HCT: 42.5 % (ref 39.0–52.0)
Hemoglobin: 14.3 g/dL (ref 13.0–17.0)
MCH: 30.2 pg (ref 26.0–34.0)
MCHC: 33.6 g/dL (ref 30.0–36.0)
MCV: 89.9 fL (ref 78.0–100.0)
Platelets: 166 10*3/uL (ref 150–400)
RBC: 4.73 MIL/uL (ref 4.22–5.81)
RDW: 13.7 % (ref 11.5–15.5)
WBC: 14.6 10*3/uL — ABNORMAL HIGH (ref 4.0–10.5)

## 2015-05-04 LAB — GLUCOSE, CAPILLARY
GLUCOSE-CAPILLARY: 330 mg/dL — AB (ref 65–99)
Glucose-Capillary: 443 mg/dL — ABNORMAL HIGH (ref 65–99)
Glucose-Capillary: 302 mg/dL — ABNORMAL HIGH (ref 65–99)
Glucose-Capillary: 385 mg/dL — ABNORMAL HIGH (ref 65–99)
Glucose-Capillary: 260 mg/dL — ABNORMAL HIGH (ref 65–99)

## 2015-05-04 LAB — HEMOGLOBIN A1C
Hgb A1c MFr Bld: 8.5 % — ABNORMAL HIGH (ref 4.8–5.6)
Mean Plasma Glucose: 197 mg/dL

## 2015-05-04 LAB — TROPONIN I: Troponin I: 0.04 ng/mL — ABNORMAL HIGH

## 2015-05-04 MED ORDER — LEVOFLOXACIN IN D5W 750 MG/150ML IV SOLN
750.0000 mg | INTRAVENOUS | Status: DC
  Administered 2015-05-06: 750 mg via INTRAVENOUS
  Filled 2015-05-04 (×2): qty 150

## 2015-05-04 MED ORDER — PERFLUTREN LIPID MICROSPHERE
1.0000 mL | INTRAVENOUS | Status: AC | PRN
  Administered 2015-05-04: 6 mL via INTRAVENOUS
  Filled 2015-05-04: qty 10

## 2015-05-04 MED ORDER — LEVALBUTEROL HCL 0.63 MG/3ML IN NEBU
0.6300 mg | INHALATION_SOLUTION | Freq: Three times a day (TID) | RESPIRATORY_TRACT | Status: DC
  Administered 2015-05-04 – 2015-05-14 (×29): 0.63 mg via RESPIRATORY_TRACT
  Filled 2015-05-04 (×30): qty 3

## 2015-05-04 MED ORDER — LEVALBUTEROL HCL 0.63 MG/3ML IN NEBU: 0.6300 mg | INHALATION_SOLUTION | Freq: Four times a day (QID) | RESPIRATORY_TRACT | Status: DC | PRN

## 2015-05-04 NOTE — Progress Notes (Signed)
ANTIBIOTIC CONSULT NOTE - follow up  Pharmacy Consult for Levaquin Indication: COPD  Allergies  Allergen Reactions  . Penicillins Rash   Patient Measurements: Height: 6\' 1"  (185.4 cm) Weight: (!) 319 lb 7.1 oz (144.9 kg) IBW/kg (Calculated) : 79.9   Vital Signs: Temp: 97.9 F (36.6 C) (07/20 1317) Temp Source: Oral (07/20 1317) BP: 151/89 mmHg (07/20 1317) Pulse Rate: 114 (07/20 1317) Intake/Output from previous day: 07/19 0701 - 07/20 0700 In: 240 [P.O.:240] Out: 850 [Urine:850] Intake/Output from this shift: Total I/O In: 480 [P.O.:480] Out: 500 [Urine:500]  Labs:  Recent Labs  05/03/15 1203 05/04/15 0558  WBC 11.8* 14.6*  HGB 14.2 14.3  PLT 154 166  CREATININE 1.77* 1.67*   Estimated Creatinine Clearance: 67.8 mL/min (by C-G formula based on Cr of 1.67). No results for input(s): VANCOTROUGH, VANCOPEAK, VANCORANDOM, GENTTROUGH, GENTPEAK, GENTRANDOM, TOBRATROUGH, TOBRAPEAK, TOBRARND, AMIKACINPEAK, AMIKACINTROU, AMIKACIN in the last 72 hours.   Microbiology: No results found for this or any previous visit (from the past 720 hour(s)).  Medical History: Past Medical History  Diagnosis Date  . Coronary artery disease     s/p cabg 2006  . Diabetes mellitus without complication   . Hypertension   . Asthma   . CKD (chronic kidney disease)    Medications:  Prescriptions prior to admission  Medication Sig Dispense Refill Last Dose  . aspirin EC 81 MG tablet Take 81 mg by mouth daily.   05/03/2015 at Unknown time  . HYDROcodone-acetaminophen (NORCO) 7.5-325 MG per tablet Take 1 tablet by mouth every 4 (four) hours as needed for moderate pain or severe pain.   0 05/03/2015 at Unknown time  . insulin aspart (NOVOLOG) 100 UNIT/ML FlexPen Inject 20 Units into the skin 3 (three) times daily with meals. Sliding scale   05/03/2015 at Unknown time  . insulin glargine (LANTUS) 100 unit/mL SOPN Inject 20-28 Units into the skin 2 (two) times daily. 28 units in the morning and  20 units before bed   05/02/2015 at Unknown time  . lisinopril (PRINIVIL,ZESTRIL) 10 MG tablet Take 10 mg by mouth daily.   unknown at Unknown time  . simvastatin (ZOCOR) 40 MG tablet Take 40 mg by mouth daily.  3 05/02/2015 at Unknown time  . oxyCODONE-acetaminophen (PERCOCET/ROXICET) 5-325 MG per tablet Take 1 tablet by mouth every 6 (six) hours as needed. 20 tablet 0    Assessment: Acute respiratory failure with hypoxia: Secondary to COPD with exacerbation.  Pt is obese with elevated SCr.  Normalized clcr ~ 6645ml/min  Goal of Therapy:  Eradicate infection  Plan:  Levaquin 750mg  IV q48hrs Monitor renal function F/U oral therapy when appropriate  Valrie HartHall, Shatyra Becka A 05/04/2015,1:53 PM

## 2015-05-04 NOTE — Progress Notes (Signed)
Subjective: The patient feels better but still has some difficulty breathing. He was admitted through the emergency department with diagnosis of acute respiratory failure he does have prior history COPD, CAD, CABG, diabetes hypertension chronic kidney disease  Objective: Vital signs in last 24 hours: Temp:  [97.5 F (36.4 C)-98.1 F (36.7 C)] 97.6 F (36.4 C) (07/20 60450608) Pulse Rate:  [59-120] 112 (07/20 0608) Resp:  [13-27] 21 (07/20 0608) BP: (110-162)/(62-108) 162/98 mmHg (07/20 0608) SpO2:  [89 %-99 %] 96 % (07/20 0608) Weight:  [122.471 kg (270 lb)-146.194 kg (322 lb 4.8 oz)] 144.9 kg (319 lb 7.1 oz) (07/20 40980608) Weight change:  Last BM Date: 05/03/15  Intake/Output from previous day: 07/19 0701 - 07/20 0700 In: 240 [P.O.:240] Out: 850 [Urine:850] Intake/Output this shift: Total I/O In: -  Out: 850 [Urine:850]  Physical Exam: Gen. appearance patient is alert small respiratory distress  HEENT negative  Neck supple no JVD or thyroid abnormality  Heart regular rhythm slight tachycardia  Lungs decreased breath sounds occasional rhonchus heard posteriorly  Abdomen the palpable organs or masses  Skin warm and dry  Extremities free of edema   Recent Labs  05/03/15 1203  WBC 11.8*  HGB 14.2  HCT 42.2  PLT 154   BMET  Recent Labs  05/03/15 1203  NA 143  K 4.5  CL 108  CO2 25  GLUCOSE 250*  BUN 31*  CREATININE 1.77*  CALCIUM 9.0    Studies/Results: Dg Chest Port 1 View  05/03/2015   CLINICAL DATA:  Shortness of Breath  EXAM: PORTABLE CHEST - 1 VIEW  COMPARISON:  05/26/2012  FINDINGS: Borderline cardiomegaly. Status post median sternotomy. No acute infiltrate or pleural effusion. No pulmonary edema.  IMPRESSION: No active disease. Borderline cardiomegaly. Status post median sternotomy.   Electronically Signed   By: Natasha MeadLiviu  Pop M.D.   On: 05/03/2015 12:22    Medications:  . antiseptic oral rinse  7 mL Mouth Rinse BID  . aspirin EC  325 mg Oral Daily   . budesonide (PULMICORT) nebulizer solution  0.25 mg Nebulization BID  . heparin  5,000 Units Subcutaneous 3 times per day  . insulin aspart  0-15 Units Subcutaneous TID WC  . insulin aspart  0-5 Units Subcutaneous QHS  . insulin glargine  15 Units Subcutaneous BID  . levalbuterol  0.63 mg Nebulization Q6H  . levofloxacin (LEVAQUIN) IV  500 mg Intravenous Q24H  . methylPREDNISolone (SOLU-MEDROL) injection  60 mg Intravenous Q6H  . simvastatin  40 mg Oral Daily  . sodium chloride  3 mL Intravenous Q12H  . sodium chloride  3 mL Intravenous Q12H        Assessment/Plan: 1. COPD and exacerbation-2 continue Levaquin Solu-Medrol neb treatments  2. Chest pain probably musculoskeletal plan to cycle enzymes  3. Hypertension-continue to monitor  Diabetes mellitus without complication continue Lantus insulin continue to monitor sugars   LOS: 1 day   Latressa Harries G 05/04/2015, 6:46 AM

## 2015-05-04 NOTE — Progress Notes (Signed)
**Note De-Identified Sheriece Jefcoat Obfuscation** RT protocol assessment:  Patients Xopenex .63 mg HHN has been changed to TID with a Q6 PRN for SOB and increased WOB.  CXR 05/03/15 impression of no active disease.  BBS clr/dim in bases.  SAT 99% in 2L [place].  RRT continue to monitor.

## 2015-05-04 NOTE — Progress Notes (Signed)
05/04/2015 CBG-330 . Hx of COPD, CABG; Hgb A1c-8.5;Hospital DM meds-Lantus 15 bid, Moderate correction scale tidac and hs. Noted patient is on Solumedrol 60 mg every 6 hours. Chart states home DM meds-Lantus 28 units in morning and 20 units in the evening Novolog 20 units tid.  Please consider increasing Lantus to home dose and adding meal coverage 10 units.  PO intake charted at 100%. Mellissa KohutSherrie Natalee Tomkiewicz RD, CDE. M.Ed. Pager [phone] Inpatient Diabetes Coordinator

## 2015-05-04 NOTE — Care Management Note (Signed)
Case Management Note  Patient Details  Name: Greer PickerelWilliam S Mesa MRN: [id number] Date of Birth: Sep 25, 1951  Subjective/Objective:                  Pt admitted from home with COPD exacerbation. Pt lives alone and will return home at discharge. Pt is independent with ADL's.  Action/Plan: Will need home O2 assessment prior to discharge. May need neb machine as well. Will continue to follow.  Expected Discharge Date:                  Expected Discharge Plan:  Home/Self Care  In-House Referral:  NA  Discharge planning Services  CM Consult  Post Acute Care Choice:  NA Choice offered to:  NA  DME Arranged:    DME Agency:     HH Arranged:    HH Agency:     Status of Service:  In process, will continue to follow  Medicare Important Message Given:    Date Medicare IM Given:    Medicare IM give by:    Date Additional Medicare IM Given:    Additional Medicare Important Message give by:     If discussed at Long Length of Stay Meetings, dates discussed:    Additional Comments:  Cheryl FlashBlackwell, Marylan Glore Crowder, RN 05/04/2015, 10:27 AM

## 2015-05-04 NOTE — Consult Note (Signed)
Consult requested by: Dr. Renard Matter Consult requested for hypoxic respiratory failure:  HPI: This is a 64 year old who had been in his usual state of fair health at home when he developed increasing shortness of breath cough and congestion. He said he had to sit up in a chair to sleep because he has severe cough when he tries to lie down. He has no other new complaints. He says at home he is short of breath with fairly minimal exertion such as taking his trash out. He is not on any medications at home for COPD although he says he was given an inhaler about a year ago. He used it up and has not had a refill. He has a greater than 30-pack-year smoking history but quit several years ago. He says he had asthma when he was a child.  Past Medical History  Diagnosis Date  . Coronary artery disease     s/p cabg 2006  . Diabetes mellitus without complication   . Hypertension   . Asthma   . CKD (chronic kidney disease)      Family History  Problem Relation Age of Onset  . Rheum arthritis Mother      History   Social History  . Marital Status: Divorced    Spouse Name: N/A  . Number of Children: N/A  . Years of Education: N/A   Social History Main Topics  . Smoking status: Former Games developer  . Smokeless tobacco: Not on file  . Alcohol Use: No  . Drug Use: No  . Sexual Activity: Not on file   Other Topics Concern  . None   Social History Narrative     ROS: He has not had any hemoptysis. He has had some chest pain. No definite fever or chills. Otherwise per the history and physical    Objective: Vital signs in last 24 hours: Temp:  [97.5 F (36.4 C)-98.1 F (36.7 C)] 97.6 F (36.4 C) (07/20 1610) Pulse Rate:  [59-120] 112 (07/20 0608) Resp:  [13-27] 21 (07/20 0608) BP: (110-162)/(62-108) 162/98 mmHg (07/20 0608) SpO2:  [89 %-99 %] 95 % (07/20 0715) Weight:  [122.471 kg (270 lb)-146.194 kg (322 lb 4.8 oz)] 144.9 kg (319 lb 7.1 oz) (07/20 9604) Weight change:  Last BM Date:  05/03/15  Intake/Output from previous day: 07/19 0701 - 07/20 0700 In: 240 [P.O.:240] Out: 850 [Urine:850]  PHYSICAL EXAM He is awake and alert. He looks a little tachypneic still.Marland Kitchen He is on oxygen. His pupils are reactive, mucous membranes are moist. His neck is supple. His chest shows bilateral rhonchi and wheezing. His heart is regular but his heart sounds are obscured to some extent by airway noise. His abdomen is soft. He has no edema. Central nervous system examination is grossly intact  Lab Results: Basic Metabolic Panel:  Recent Labs  54/09/81 1203 05/04/15 0558  NA 143 137  K 4.5 4.1  CL 108 105  CO2 25 21*  GLUCOSE 250* 363*  BUN 31* 35*  CREATININE 1.77* 1.67*  CALCIUM 9.0 9.0   Liver Function Tests:  Recent Labs  05/03/15 1203  AST 44*  ALT 35  ALKPHOS 63  BILITOT 0.3  PROT 6.4*  ALBUMIN 3.9   No results for input(s): LIPASE, AMYLASE in the last 72 hours. No results for input(s): AMMONIA in the last 72 hours. CBC:  Recent Labs  05/03/15 1203 05/04/15 0558  WBC 11.8* 14.6*  NEUTROABS 8.8*  --   HGB 14.2 14.3  HCT 42.2 42.5  MCV 90.9 89.9  PLT 154 166   Cardiac Enzymes:  Recent Labs  05/03/15 1731 05/03/15 2352  TROPONINI 0.05* 0.04*   BNP: No results for input(s): PROBNP in the last 72 hours. D-Dimer: No results for input(s): DDIMER in the last 72 hours. CBG:  Recent Labs  05/03/15 1654 05/03/15 2323 05/04/15 0723  GLUCAP 229* 443* 330*   Hemoglobin A1C:  Recent Labs  05/03/15 1203  HGBA1C 8.5*   Fasting Lipid Panel: No results for input(s): CHOL, HDL, LDLCALC, TRIG, CHOLHDL, LDLDIRECT in the last 72 hours. Thyroid Function Tests:  Recent Labs  05/03/15 1203  TSH 1.701   Anemia Panel: No results for input(s): VITAMINB12, FOLATE, FERRITIN, TIBC, IRON, RETICCTPCT in the last 72 hours. Coagulation: No results for input(s): LABPROT, INR in the last 72 hours. Urine Drug Screen: Drugs of Abuse  No results found for:  LABOPIA, COCAINSCRNUR, LABBENZ, AMPHETMU, THCU, LABBARB  Alcohol Level: No results for input(s): ETH in the last 72 hours. Urinalysis: No results for input(s): COLORURINE, LABSPEC, PHURINE, GLUCOSEU, HGBUR, BILIRUBINUR, KETONESUR, PROTEINUR, UROBILINOGEN, NITRITE, LEUKOCYTESUR in the last 72 hours.  Invalid input(s): APPERANCEUR Misc. Labs:   ABGS: No results for input(s): PHART, PO2ART, TCO2, HCO3 in the last 72 hours.  Invalid input(s): PCO2   MICROBIOLOGY: No results found for this or any previous visit (from the past 240 hour(s)).  Studies/Results: Dg Chest Port 1 View  05/03/2015   CLINICAL DATA:  Shortness of Breath  EXAM: PORTABLE CHEST - 1 VIEW  COMPARISON:  05/26/2012  FINDINGS: Borderline cardiomegaly. Status post median sternotomy. No acute infiltrate or pleural effusion. No pulmonary edema.  IMPRESSION: No active disease. Borderline cardiomegaly. Status post median sternotomy.   Electronically Signed   By: Natasha Mead M.D.   On: 05/03/2015 12:22    Medications:  Prior to Admission:  Prescriptions prior to admission  Medication Sig Dispense Refill Last Dose  . aspirin EC 81 MG tablet Take 81 mg by mouth daily.   05/03/2015 at Unknown time  . HYDROcodone-acetaminophen (NORCO) 7.5-325 MG per tablet Take 1 tablet by mouth every 4 (four) hours as needed for moderate pain or severe pain.   0 05/03/2015 at Unknown time  . insulin aspart (NOVOLOG) 100 UNIT/ML FlexPen Inject 20 Units into the skin 3 (three) times daily with meals. Sliding scale   05/03/2015 at Unknown time  . insulin glargine (LANTUS) 100 unit/mL SOPN Inject 20-28 Units into the skin 2 (two) times daily. 28 units in the morning and 20 units before bed   05/02/2015 at Unknown time  . lisinopril (PRINIVIL,ZESTRIL) 10 MG tablet Take 10 mg by mouth daily.   unknown at Unknown time  . simvastatin (ZOCOR) 40 MG tablet Take 40 mg by mouth daily.  3 05/02/2015 at Unknown time  . oxyCODONE-acetaminophen (PERCOCET/ROXICET) 5-325  MG per tablet Take 1 tablet by mouth every 6 (six) hours as needed. 20 tablet 0    Scheduled: . antiseptic oral rinse  7 mL Mouth Rinse BID  . aspirin EC  325 mg Oral Daily  . budesonide (PULMICORT) nebulizer solution  0.25 mg Nebulization BID  . heparin  5,000 Units Subcutaneous 3 times per day  . insulin aspart  0-15 Units Subcutaneous TID WC  . insulin aspart  0-5 Units Subcutaneous QHS  . insulin glargine  15 Units Subcutaneous BID  . levalbuterol  0.63 mg Nebulization Q6H  . levofloxacin (LEVAQUIN) IV  500 mg Intravenous Q24H  . methylPREDNISolone (SOLU-MEDROL) injection  60 mg Intravenous  Q6H  . simvastatin  40 mg Oral Daily  . sodium chloride  3 mL Intravenous Q12H  . sodium chloride  3 mL Intravenous Q12H   Continuous:  ZOX:WRUEAVPRN:sodium chloride, acetaminophen **OR** acetaminophen, alum & mag hydroxide-simeth, guaiFENesin-dextromethorphan, HYDROcodone-acetaminophen, magnesium hydroxide, ondansetron **OR** ondansetron (ZOFRAN) IV, sodium chloride, sorbitol  Assesment: He has acute hypoxic respiratory failure and COPD exacerbation. He is not on inhalers at home and will need these when he is discharged. He will also need to have pulmonary function testing as an outpatient. He has improved and is currently on appropriate treatment with an antibiotic steroids and inhaled bronchodilators. Principal Problem:   Acute respiratory failure with hypoxia Active Problems:   COPD (chronic obstructive pulmonary disease)   COPD with exacerbation   Sinus tachycardia   Diabetes mellitus without complication   Hypertension   Coronary artery disease   Chest pain   CKD (chronic kidney disease)   Obesity   Acute respiratory failure    Plan: When he has a little bit less wheezing I will start him on maintenance inhalers. For now continue with nebulized steroids and bronchodilators.      LOS: 1 day   Glynn Freas L 05/04/2015, 8:19 AM

## 2015-05-04 NOTE — Progress Notes (Signed)
Notified by central telemetry of Pt having aflutter. Notified on call MD and was notified to continue to monitor heart rate. Will continue to monitor pt.

## 2015-05-05 LAB — GLUCOSE, CAPILLARY
GLUCOSE-CAPILLARY: 294 mg/dL — AB (ref 65–99)
GLUCOSE-CAPILLARY: 266 mg/dL — AB (ref 65–99)
GLUCOSE-CAPILLARY: 267 mg/dL — AB (ref 65–99)
Glucose-Capillary: 290 mg/dL — ABNORMAL HIGH (ref 65–99)

## 2015-05-05 MED ORDER — INSULIN ASPART 100 UNIT/ML ~~LOC~~ SOLN
0.0000 [IU] | Freq: Every day | SUBCUTANEOUS | Status: DC
  Administered 2015-05-05: 3 [IU] via SUBCUTANEOUS
  Administered 2015-05-06: 2 [IU] via SUBCUTANEOUS
  Administered 2015-05-07: 3 [IU] via SUBCUTANEOUS
  Administered 2015-05-10: 2 [IU] via SUBCUTANEOUS
  Administered 2015-05-11: 3 [IU] via SUBCUTANEOUS
  Administered 2015-05-12: 2 [IU] via SUBCUTANEOUS

## 2015-05-05 MED ORDER — BUDESONIDE-FORMOTEROL FUMARATE 160-4.5 MCG/ACT IN AERO
2.0000 | INHALATION_SPRAY | Freq: Two times a day (BID) | RESPIRATORY_TRACT | Status: DC
  Administered 2015-05-05 – 2015-05-14 (×19): 2 via RESPIRATORY_TRACT
  Filled 2015-05-05: qty 6

## 2015-05-05 MED ORDER — INSULIN GLARGINE 100 UNIT/ML ~~LOC~~ SOLN
25.0000 [IU] | Freq: Two times a day (BID) | SUBCUTANEOUS | Status: DC
  Administered 2015-05-05 – 2015-05-14 (×19): 25 [IU] via SUBCUTANEOUS
  Filled 2015-05-05 (×23): qty 0.25

## 2015-05-05 MED ORDER — INSULIN ASPART 100 UNIT/ML ~~LOC~~ SOLN
0.0000 [IU] | Freq: Three times a day (TID) | SUBCUTANEOUS | Status: DC
Start: 2015-05-05 — End: 2015-05-14
  Administered 2015-05-05 – 2015-05-06 (×3): 8 [IU] via SUBCUTANEOUS
  Administered 2015-05-06 (×2): 11 [IU] via SUBCUTANEOUS
  Administered 2015-05-07: 5 [IU] via SUBCUTANEOUS
  Administered 2015-05-07: 8 [IU] via SUBCUTANEOUS
  Administered 2015-05-07 – 2015-05-08 (×2): 11 [IU] via SUBCUTANEOUS
  Administered 2015-05-08 (×2): 5 [IU] via SUBCUTANEOUS
  Administered 2015-05-09 – 2015-05-10 (×3): 3 [IU] via SUBCUTANEOUS
  Administered 2015-05-10: 2 [IU] via SUBCUTANEOUS
  Administered 2015-05-11: 5 [IU] via SUBCUTANEOUS
  Administered 2015-05-11 – 2015-05-12 (×3): 3 [IU] via SUBCUTANEOUS
  Administered 2015-05-13: 2 [IU] via SUBCUTANEOUS
  Administered 2015-05-13: 3 [IU] via SUBCUTANEOUS
  Administered 2015-05-13: 5 [IU] via SUBCUTANEOUS
  Administered 2015-05-14: 2 [IU] via SUBCUTANEOUS

## 2015-05-05 MED ORDER — INSULIN ASPART 100 UNIT/ML ~~LOC~~ SOLN
10.0000 [IU] | Freq: Three times a day (TID) | SUBCUTANEOUS | Status: DC
  Administered 2015-05-05 – 2015-05-12 (×23): 10 [IU] via SUBCUTANEOUS

## 2015-05-05 MED ORDER — TIOTROPIUM BROMIDE MONOHYDRATE 18 MCG IN CAPS
18.0000 ug | ORAL_CAPSULE | Freq: Every day | RESPIRATORY_TRACT | Status: DC
  Administered 2015-05-05 – 2015-05-14 (×10): 18 ug via RESPIRATORY_TRACT
  Filled 2015-05-05 (×2): qty 5

## 2015-05-05 NOTE — Progress Notes (Signed)
Subjective: The patient continues to improve he was admitted with diagnosis of acute respiratory failure and has a prior history COPD CAD CABG diabetes hypertension chronic kidney disease  Objective: Vital signs in last 24 hours: Temp:  [97.8 F (36.6 C)-98.1 F (36.7 C)] 97.8 F (36.6 C) (07/21 0623) Pulse Rate:  [65-117] 65 (07/21 0623) Resp:  [20-22] 20 (07/21 0623) BP: (151-166)/(89-108) 166/108 mmHg (07/21 0623) SpO2:  [94 %-98 %] 98 % (07/21 0623) Weight:  [143.9 kg (317 lb 3.9 oz)] 143.9 kg (317 lb 3.9 oz) (07/21 0626) Weight change: 21.429 kg (47 lb 3.9 oz) Last BM Date: 05/03/15  Intake/Output from previous day: 07/20 0701 - 07/21 0700 In: 480 [P.O.:480] Out: 1800 [Urine:1800] Intake/Output this shift: Total I/O In: -  Out: 1300 [Urine:1300]  Physical Exam: Gen. appearance patient is alert and oriented  HEENT negative  Neck supple no JVD or thyroid abnormality  Heart regular rhythm no murmurs  Lungs diminished breath sounds occasional rhonchus heard posteriorly  Abdomen no palpable organs or masses  Skin warm and dry  Extremities free of edema   Recent Labs  05/03/15 1203 05/04/15 0558  WBC 11.8* 14.6*  HGB 14.2 14.3  HCT 42.2 42.5  PLT 154 166   BMET  Recent Labs  05/03/15 1203 05/04/15 0558  NA 143 137  K 4.5 4.1  CL 108 105  CO2 25 21*  GLUCOSE 250* 363*  BUN 31* 35*  CREATININE 1.77* 1.67*  CALCIUM 9.0 9.0    Studies/Results: Dg Chest Port 1 View  05/03/2015   CLINICAL DATA:  Shortness of Breath  EXAM: PORTABLE CHEST - 1 VIEW  COMPARISON:  05/26/2012  FINDINGS: Borderline cardiomegaly. Status post median sternotomy. No acute infiltrate or pleural effusion. No pulmonary edema.  IMPRESSION: No active disease. Borderline cardiomegaly. Status post median sternotomy.   Electronically Signed   By: Natasha Mead M.D.   On: 05/03/2015 12:22    Medications:  . antiseptic oral rinse  7 mL Mouth Rinse BID  . aspirin EC  325 mg Oral Daily  .  budesonide (PULMICORT) nebulizer solution  0.25 mg Nebulization BID  . heparin  5,000 Units Subcutaneous 3 times per day  . insulin aspart  0-15 Units Subcutaneous TID WC  . insulin aspart  0-5 Units Subcutaneous QHS  . insulin glargine  15 Units Subcutaneous BID  . levalbuterol  0.63 mg Nebulization TID  . [START ON 05/06/2015] levofloxacin (LEVAQUIN) IV  750 mg Intravenous Q48H  . methylPREDNISolone (SOLU-MEDROL) injection  60 mg Intravenous Q6H  . simvastatin  40 mg Oral Daily  . sodium chloride  3 mL Intravenous Q12H  . sodium chloride  3 mL Intravenous Q12H        Assessment/Plan: 1. COPD acute on chronic respiratory failure with some improvement to continue Solu-Medrol nebulizer treatments and Levaquin  2. Chest pain musculoskeletal  3. Hypertension continue to monitor. Antihypertensives if needed  4. Diabetes mellitus without complication continue Lantus insulin continue to monitor sugars and short-acting insulin   LOS: 2 days   Everlina Gotts G 05/05/2015, 6:41 AM

## 2015-05-05 NOTE — Progress Notes (Signed)
Inpatient Diabetes Program Recommendations  AACE/ADA: New Consensus Statement on Inpatient Glycemic Control (2013)  Target Ranges:  Prepandial:   less than 140 mg/dL      Peak postprandial:   less than 180 mg/dL (1-2 hours)      Critically ill patients:  140 - 180 mg/dL   Results for DERECK, AGERTON (MRN [id number]) as of 05/05/2015 09:57  Ref. Range 05/04/2015 07:23 05/04/2015 11:12 05/04/2015 16:25 05/04/2015 20:58 05/05/2015 07:54  Glucose-Capillary Latest Ref Range: 65-99 mg/dL 782 (H) 956 (H) 213 (H) 260 (H) 294 (H)    Diabetes history: DM2 Outpatient Diabetes medications: Lantus 28 units QAM, Lantus 20 units QHS, Novolog 20 units TID with meals Current orders for Inpatient glycemic control: Lantus 15 units BID, Novolog 0-15 units TID with meals, Novolog 0-5 units HS  Inpatient Diabetes Program Recommendations Insulin - Basal: Please consider increasing Lantus to 25 units BID. Insulin - Meal Coverage: Please consider ordering Novolog 10 units TID with meals if patient is eating at least 50% of meals.  Thanks, Orlando Penner, RN, MSN, CCRN, CDE Diabetes Coordinator Inpatient Diabetes Program [phone] (Team Pager from 8am to 5pm) [phone] ([facility] office) [phone] [facility] office) [phone] [facility] office)

## 2015-05-05 NOTE — Progress Notes (Signed)
Subjective: He says he feels better but is still short of breath  Objective: Vital signs in last 24 hours: Temp:  [97.8 F (36.6 C)-98.1 F (36.7 C)] 97.8 F (36.6 C) (07/21 0623) Pulse Rate:  [65-117] 65 (07/21 0623) Resp:  [20-22] 20 (07/21 0623) BP: (151-166)/(89-108) 166/108 mmHg (07/21 0623) SpO2:  [94 %-98 %] 97 % (07/21 0833) Weight:  [143.9 kg (317 lb 3.9 oz)] 143.9 kg (317 lb 3.9 oz) (07/21 0626) Weight change: 21.429 kg (47 lb 3.9 oz) Last BM Date: 05/03/15  Intake/Output from previous day: 07/20 0701 - 07/21 0700 In: 480 [P.O.:480] Out: 1800 [Urine:1800]  PHYSICAL EXAM General appearance: alert, cooperative and mild distress Resp: rhonchi bilaterally and wheezes bilaterally Cardio: regular rate and rhythm, S1, S2 normal, no murmur, click, rub or gallop GI: soft, non-tender; bowel sounds normal; no masses,  no organomegaly Extremities: extremities normal, atraumatic, no cyanosis or edema  Lab Results:  Results for orders placed or performed during the hospital encounter of 05/03/15 (from the past 48 hour(s))  CBC with Differential/Platelet     Status: Abnormal   Collection Time: 05/03/15 12:03 PM  Result Value Ref Range   WBC 11.8 (H) 4.0 - 10.5 K/uL   RBC 4.64 4.22 - 5.81 MIL/uL   Hemoglobin 14.2 13.0 - 17.0 g/dL   HCT 42.2 39.0 - 52.0 %   MCV 90.9 78.0 - 100.0 fL   MCH 30.6 26.0 - 34.0 pg   MCHC 33.6 30.0 - 36.0 g/dL   RDW 13.7 11.5 - 15.5 %   Platelets 154 150 - 400 K/uL   Neutrophils Relative % 75 43 - 77 %   Neutro Abs 8.8 (H) 1.7 - 7.7 K/uL   Lymphocytes Relative 18 12 - 46 %   Lymphs Abs 2.2 0.7 - 4.0 K/uL   Monocytes Relative 6 3 - 12 %   Monocytes Absolute 0.7 0.1 - 1.0 K/uL   Eosinophils Relative 1 0 - 5 %   Eosinophils Absolute 0.1 0.0 - 0.7 K/uL   Basophils Relative 0 0 - 1 %   Basophils Absolute 0.0 0.0 - 0.1 K/uL  Comprehensive metabolic panel     Status: Abnormal   Collection Time: 05/03/15 12:03 PM  Result Value Ref Range   Sodium 143  135 - 145 mmol/L   Potassium 4.5 3.5 - 5.1 mmol/L   Chloride 108 101 - 111 mmol/L   CO2 25 22 - 32 mmol/L   Glucose, Bld 250 (H) 65 - 99 mg/dL   BUN 31 (H) 6 - 20 mg/dL   Creatinine, Ser 1.77 (H) 0.61 - 1.24 mg/dL   Calcium 9.0 8.9 - 10.3 mg/dL   Total Protein 6.4 (L) 6.5 - 8.1 g/dL   Albumin 3.9 3.5 - 5.0 g/dL   AST 44 (H) 15 - 41 U/L   ALT 35 17 - 63 U/L   Alkaline Phosphatase 63 38 - 126 U/L   Total Bilirubin 0.3 0.3 - 1.2 mg/dL   GFR calc non Af Amer 39 (L) >60 mL/min   GFR calc Af Amer 45 (L) >60 mL/min    Comment: (NOTE) The eGFR has been calculated using the CKD EPI equation. This calculation has not been validated in all clinical situations. eGFR's persistently <60 mL/min signify possible Chronic Kidney Disease.    Anion gap 10 5 - 15  Brain natriuretic peptide     Status: Abnormal   Collection Time: 05/03/15 12:03 PM  Result Value Ref Range   B Natriuretic Peptide  220.0 (H) 0.0 - 100.0 pg/mL  TSH     Status: None   Collection Time: 05/03/15 12:03 PM  Result Value Ref Range   TSH 1.701 0.350 - 4.500 uIU/mL  Hemoglobin A1c     Status: Abnormal   Collection Time: 05/03/15 12:03 PM  Result Value Ref Range   Hgb A1c MFr Bld 8.5 (H) 4.8 - 5.6 %    Comment: (NOTE)         Pre-diabetes: 5.7 - 6.4         Diabetes: >6.4         Glycemic control for adults with diabetes: <7.0    Mean Plasma Glucose 197 mg/dL    Comment: (NOTE) Performed At: [facility] [address] [place], [place] [id number] Lindon Romp MD WU:[id number]   I-stat troponin, ED     Status: None   Collection Time: 05/03/15 12:07 PM  Result Value Ref Range   Troponin i, poc 0.04 0.00 - 0.08 ng/mL   Comment 3            Comment: Due to the release kinetics of cTnI, a negative result within the first hours of the onset of symptoms does not rule out myocardial infarction with certainty. If myocardial infarction is still suspected, repeat the test at appropriate intervals.   Glucose,  capillary     Status: Abnormal   Collection Time: 05/03/15  4:54 PM  Result Value Ref Range   Glucose-Capillary 229 (H) 65 - 99 mg/dL  Troponin I     Status: Abnormal   Collection Time: 05/03/15  5:31 PM  Result Value Ref Range   Troponin I 0.05 (H) <0.031 ng/mL    Comment:        PERSISTENTLY INCREASED TROPONIN VALUES IN THE RANGE OF 0.04-0.49 ng/mL CAN BE SEEN IN:       -UNSTABLE ANGINA       -CONGESTIVE HEART FAILURE       -MYOCARDITIS       -CHEST TRAUMA       -ARRYHTHMIAS       -LATE PRESENTING MYOCARDIAL INFARCTION       -COPD   CLINICAL FOLLOW-UP RECOMMENDED.   Glucose, capillary     Status: Abnormal   Collection Time: 05/03/15 11:23 PM  Result Value Ref Range   Glucose-Capillary 443 (H) 65 - 99 mg/dL   Comment 1 Notify RN    Comment 2 Document in Chart   Troponin I     Status: Abnormal   Collection Time: 05/03/15 11:52 PM  Result Value Ref Range   Troponin I 0.04 (H) <0.031 ng/mL    Comment:        PERSISTENTLY INCREASED TROPONIN VALUES IN THE RANGE OF 0.04-0.49 ng/mL CAN BE SEEN IN:       -UNSTABLE ANGINA       -CONGESTIVE HEART FAILURE       -MYOCARDITIS       -CHEST TRAUMA       -ARRYHTHMIAS       -LATE PRESENTING MYOCARDIAL INFARCTION       -COPD   CLINICAL FOLLOW-UP RECOMMENDED.   Basic metabolic panel     Status: Abnormal   Collection Time: 05/04/15  5:58 AM  Result Value Ref Range   Sodium 137 135 - 145 mmol/L   Potassium 4.1 3.5 - 5.1 mmol/L   Chloride 105 101 - 111 mmol/L   CO2 21 (L) 22 - 32 mmol/L   Glucose, Bld 363 (H) 65 -  99 mg/dL   BUN 35 (H) 6 - 20 mg/dL   Creatinine, Ser 1.67 (H) 0.61 - 1.24 mg/dL   Calcium 9.0 8.9 - 10.3 mg/dL   GFR calc non Af Amer 42 (L) >60 mL/min   GFR calc Af Amer 49 (L) >60 mL/min    Comment: (NOTE) The eGFR has been calculated using the CKD EPI equation. This calculation has not been validated in all clinical situations. eGFR's persistently <60 mL/min signify possible Chronic Kidney Disease.    Anion gap  11 5 - 15  CBC     Status: Abnormal   Collection Time: 05/04/15  5:58 AM  Result Value Ref Range   WBC 14.6 (H) 4.0 - 10.5 K/uL   RBC 4.73 4.22 - 5.81 MIL/uL   Hemoglobin 14.3 13.0 - 17.0 g/dL   HCT 42.5 39.0 - 52.0 %   MCV 89.9 78.0 - 100.0 fL   MCH 30.2 26.0 - 34.0 pg   MCHC 33.6 30.0 - 36.0 g/dL   RDW 13.7 11.5 - 15.5 %   Platelets 166 150 - 400 K/uL  Glucose, capillary     Status: Abnormal   Collection Time: 05/04/15  7:23 AM  Result Value Ref Range   Glucose-Capillary 330 (H) 65 - 99 mg/dL   Comment 1 Notify RN    Comment 2 Document in Chart   Glucose, capillary     Status: Abnormal   Collection Time: 05/04/15 11:12 AM  Result Value Ref Range   Glucose-Capillary 302 (H) 65 - 99 mg/dL   Comment 1 Notify RN    Comment 2 Document in Chart   Glucose, capillary     Status: Abnormal   Collection Time: 05/04/15  4:25 PM  Result Value Ref Range   Glucose-Capillary 385 (H) 65 - 99 mg/dL   Comment 1 Notify RN   Glucose, capillary     Status: Abnormal   Collection Time: 05/04/15  8:58 PM  Result Value Ref Range   Glucose-Capillary 260 (H) 65 - 99 mg/dL  Glucose, capillary     Status: Abnormal   Collection Time: 05/05/15  7:54 AM  Result Value Ref Range   Glucose-Capillary 294 (H) 65 - 99 mg/dL   Comment 1 Notify RN     ABGS No results for input(s): PHART, PO2ART, TCO2, HCO3 in the last 72 hours.  Invalid input(s): PCO2 CULTURES No results found for this or any previous visit (from the past 240 hour(s)). Studies/Results: Dg Chest Port 1 View  05/03/2015   CLINICAL DATA:  Shortness of Breath  EXAM: PORTABLE CHEST - 1 VIEW  COMPARISON:  05/26/2012  FINDINGS: Borderline cardiomegaly. Status post median sternotomy. No acute infiltrate or pleural effusion. No pulmonary edema.  IMPRESSION: No active disease. Borderline cardiomegaly. Status post median sternotomy.   Electronically Signed   By: Lahoma Crocker M.D.   On: 05/03/2015 12:22    Medications:  Prior to Admission:   Prescriptions prior to admission  Medication Sig Dispense Refill Last Dose  . aspirin EC 81 MG tablet Take 81 mg by mouth daily.   05/03/2015 at Unknown time  . HYDROcodone-acetaminophen (NORCO) 7.5-325 MG per tablet Take 1 tablet by mouth every 4 (four) hours as needed for moderate pain or severe pain.   0 05/03/2015 at Unknown time  . insulin aspart (NOVOLOG) 100 UNIT/ML FlexPen Inject 20 Units into the skin 3 (three) times daily with meals. Sliding scale   05/03/2015 at Unknown time  . insulin glargine (LANTUS) 100 unit/mL  SOPN Inject 20-28 Units into the skin 2 (two) times daily. 28 units in the morning and 20 units before bed   05/02/2015 at Unknown time  . lisinopril (PRINIVIL,ZESTRIL) 10 MG tablet Take 10 mg by mouth daily.   unknown at Unknown time  . simvastatin (ZOCOR) 40 MG tablet Take 40 mg by mouth daily.  3 05/02/2015 at Unknown time  . oxyCODONE-acetaminophen (PERCOCET/ROXICET) 5-325 MG per tablet Take 1 tablet by mouth every 6 (six) hours as needed. 20 tablet 0    Scheduled: . antiseptic oral rinse  7 mL Mouth Rinse BID  . aspirin EC  325 mg Oral Daily  . budesonide (PULMICORT) nebulizer solution  0.25 mg Nebulization BID  . heparin  5,000 Units Subcutaneous 3 times per day  . insulin aspart  0-15 Units Subcutaneous TID WC  . insulin aspart  0-5 Units Subcutaneous QHS  . insulin glargine  15 Units Subcutaneous BID  . levalbuterol  0.63 mg Nebulization TID  . [START ON 05/06/2015] levofloxacin (LEVAQUIN) IV  750 mg Intravenous Q48H  . methylPREDNISolone (SOLU-MEDROL) injection  60 mg Intravenous Q6H  . simvastatin  40 mg Oral Daily  . sodium chloride  3 mL Intravenous Q12H  . sodium chloride  3 mL Intravenous Q12H   Continuous:  OEH:OZYYQM chloride, acetaminophen **OR** acetaminophen, alum & mag hydroxide-simeth, guaiFENesin-dextromethorphan, HYDROcodone-acetaminophen, levalbuterol, magnesium hydroxide, ondansetron **OR** ondansetron (ZOFRAN) IV, sodium chloride,  sorbitol  Assesment: He was admitted with acute hypoxic respiratory failure and COPD exacerbation. I think his COPD is pretty severe at baseline but he has not had PFTs done yet. He is improving slowly but still has significant wheezing and rhonchi Principal Problem:   Acute respiratory failure with hypoxia Active Problems:   COPD (chronic obstructive pulmonary disease)   COPD with exacerbation   Sinus tachycardia   Diabetes mellitus without complication   Hypertension   Coronary artery disease   Chest pain   CKD (chronic kidney disease)   Obesity   Acute respiratory failure    Plan: I will add Spiriva and Symbicort    LOS: 2 days   Julieanna Geraci L 05/05/2015, 8:53 AM

## 2015-05-05 NOTE — Clinical Documentation Improvement (Signed)
Please identify and document the stage of CKD, if known, in your future progress notes and discharge summary.  CKD documented in current record with "Chart review indicates baseline creatinine 1.7". Labs for current admission below.    Component      BUN Creatinine  Latest Ref Rng      6 - 20 mg/dL 0.61 - 1.24 mg/dL  05/03/2015     12:03 PM 31 (H) 1.77 (H)  05/04/2015      5:58 AM 35 (H) 1.67 (H)    Component      EGFR (Non-African Amer.)  Latest Ref Rng      >60 mL/min  05/03/2015     12:03 PM 39 (L)  05/04/2015      5:58 AM 42 (L)   Possible Clinical Conditions / Stages CKD:  -CKD Stage I - GFR > OR = 90 -CKD Stage II - GFR 60-80 -CKD Stage III - GFR 30-59 -CKD Stage IV - GFR 15-29 -CKD Stage V - GFR < 15 -Other condition_____________ -Cannot Clinically determine   Thank you, Mateo Flow, RN [phone] Clinical Documentation Specialist

## 2015-05-06 LAB — GLUCOSE, CAPILLARY
Glucose-Capillary: 313 mg/dL — ABNORMAL HIGH (ref 65–99)
Glucose-Capillary: 303 mg/dL — ABNORMAL HIGH (ref 65–99)
Glucose-Capillary: 277 mg/dL — ABNORMAL HIGH (ref 65–99)
Glucose-Capillary: 239 mg/dL — ABNORMAL HIGH (ref 65–99)

## 2015-05-06 MED ORDER — METOPROLOL TARTRATE 50 MG PO TABS
50.0000 mg | ORAL_TABLET | Freq: Two times a day (BID) | ORAL | Status: DC
  Administered 2015-05-06 – 2015-05-10 (×9): 50 mg via ORAL
  Filled 2015-05-06 (×9): qty 1

## 2015-05-06 MED ORDER — LISINOPRIL 10 MG PO TABS
10.0000 mg | ORAL_TABLET | Freq: Every day | ORAL | Status: DC
  Administered 2015-05-06 – 2015-05-14 (×9): 10 mg via ORAL
  Filled 2015-05-06 (×9): qty 1

## 2015-05-06 NOTE — Progress Notes (Signed)
Subjective: This patient continues to improve. He is more comfortable. His admitted with increased respiratory rate. He does have acute on chronic respiratory failure with prior history COPD CAD CABG diabetes mellitus and hypertension chronic kidney disease  Objective: Vital signs in last 24 hours: Temp:  [98.1 F (36.7 C)-98.2 F (36.8 C)] 98.1 F (36.7 C) (07/22 0636) Pulse Rate:  [84-124] 124 (07/22 0636) Resp:  [20] 20 (07/22 0636) BP: (147-160)/(92-114) 157/114 mmHg (07/22 0636) SpO2:  [93 %-97 %] 93 % (07/22 0636) Weight:  [144 kg (317 lb 7.4 oz)] 144 kg (317 lb 7.4 oz) (07/22 0636) Weight change: 0.1 kg (3.5 oz) Last BM Date: 05/05/15  Intake/Output from previous day: 07/21 0701 - 07/22 0700 In: 600 [P.O.:600] Out: 1900 [Urine:1900] Intake/Output this shift: Total I/O In: -  Out: 500 [Urine:500]  Physical Exam: Gen. per the patient is alert and oriented  HEENT negative  Neck supple no JVD or thyroid abnormalities  Heart regular rhythm no murmurs  Lungs diminished breath sounds with occasional rhonchus heard posteriorly  Abdomen no palpable organs or masses  Skin warm and dry  Extremities free of edema   Recent Labs  05/03/15 1203 05/04/15 0558  WBC 11.8* 14.6*  HGB 14.2 14.3  HCT 42.2 42.5  PLT 154 166   BMET  Recent Labs  05/03/15 1203 05/04/15 0558  NA 143 137  K 4.5 4.1  CL 108 105  CO2 25 21*  GLUCOSE 250* 363*  BUN 31* 35*  CREATININE 1.77* 1.67*  CALCIUM 9.0 9.0    Studies/Results: No results found.  Medications:  . antiseptic oral rinse  7 mL Mouth Rinse BID  . aspirin EC  325 mg Oral Daily  . budesonide-formoterol  2 puff Inhalation BID  . heparin  5,000 Units Subcutaneous 3 times per day  . insulin aspart  0-15 Units Subcutaneous TID WC  . insulin aspart  0-5 Units Subcutaneous QHS  . insulin aspart  10 Units Subcutaneous TID WC  . insulin glargine  25 Units Subcutaneous BID  . levalbuterol  0.63 mg Nebulization TID  .  levofloxacin (LEVAQUIN) IV  750 mg Intravenous Q48H  . methylPREDNISolone (SOLU-MEDROL) injection  60 mg Intravenous Q6H  . simvastatin  40 mg Oral Daily  . sodium chloride  3 mL Intravenous Q12H  . sodium chloride  3 mL Intravenous Q12H  . tiotropium  18 mcg Inhalation Daily        Assessment/Plan: 1. COPD acute on chronic respiratory failure with some improvement to continue IV Levaquin and continue nebulizer treatments and Solu-Medrol 2. Hypertension essential continue to monitor  3. Diabetes mellitus without complications to continue Lantus insulin continue short-acting insulin    LOS: 3 days   Ziggy Chanthavong G 05/06/2015, 6:39 AM

## 2015-05-06 NOTE — Progress Notes (Signed)
Dr. Renard Matter made aware of patient's HR running in the 120's x 24 hours or longer, patient asymptomatic, monitor shows Sinus Tachycardia with HR 124, orders received.

## 2015-05-06 NOTE — Progress Notes (Signed)
Patient had a 6 beat run of ventricular tachycardia. Post event vitals remained stable. Pt denied shortness of breath and chest pain. Dr Conley Rolls notified. Will continue to monitor.

## 2015-05-06 NOTE — Progress Notes (Signed)
CBGs continue to be greater than 180 mg/dl.  Fasting CBG this am is 313 mg/dl.  Recommend increasing Lantus to 28 units BID and increasing the Novolog meal coverage to 12 units TID.  Will continue to follow while in hospital. Smith Mince RN BSN CDE

## 2015-05-06 NOTE — Progress Notes (Signed)
Dr. Renard Matter notified 157/114 blood pressure. New orders received. Will continue to monitor.

## 2015-05-06 NOTE — Care Management Note (Signed)
Case Management Note  Patient Details  Name: ALLEN EGERTON MRN: [id number] Date of Birth: 1951/07/25  Subjective/Objective:                    Action/Plan:   Expected Discharge Date:                  Expected Discharge Plan:  Home/Self Care  In-House Referral:  NA  Discharge planning Services  CM Consult  Post Acute Care Choice:  NA Choice offered to:  NA  DME Arranged:  Nebulizer machine DME Agency:  [facility]  HH Arranged:    [facility] Agency:     Status of Service:  Completed, signed off  Medicare Important Message Given:    Date Medicare IM Given:    Medicare IM give by:    Date Additional Medicare IM Given:    Additional Medicare Important Message give by:     If discussed at Long Length of Stay Meetings, dates discussed:    Additional Comments: Anticipate discharge over the weekend. Per pulmonology pt will probably need neb machine at discharge. Pt would like to obtain from [place]. Weekend staff will need to arrange when order written. Arlyss Queen Beaver, RN 05/06/2015, 10:42 AM

## 2015-05-07 LAB — GLUCOSE, CAPILLARY
Glucose-Capillary: 281 mg/dL — ABNORMAL HIGH (ref 65–99)
Glucose-Capillary: 303 mg/dL — ABNORMAL HIGH (ref 65–99)
Glucose-Capillary: 245 mg/dL — ABNORMAL HIGH (ref 65–99)
Glucose-Capillary: 267 mg/dL — ABNORMAL HIGH (ref 65–99)

## 2015-05-07 LAB — BASIC METABOLIC PANEL
Anion gap: 12 (ref 5–15)
BUN: 54 mg/dL — AB (ref 6–20)
CO2: 18 mmol/L — ABNORMAL LOW (ref 22–32)
CREATININE: 1.59 mg/dL — AB (ref 0.61–1.24)
Calcium: 7.9 mg/dL — ABNORMAL LOW (ref 8.9–10.3)
Chloride: 106 mmol/L (ref 101–111)
GFR calc non Af Amer: 45 mL/min — ABNORMAL LOW (ref >60)
GFR, EST AFRICAN AMERICAN: 52 mL/min — AB (ref >60)
Glucose, Bld: 284 mg/dL — ABNORMAL HIGH (ref 65–99)
POTASSIUM: 4.4 mmol/L (ref 3.5–5.1)
Sodium: 136 mmol/L (ref 135–145)

## 2015-05-07 MED ORDER — LEVOFLOXACIN 750 MG PO TABS
750.0000 mg | ORAL_TABLET | ORAL | Status: DC
  Administered 2015-05-08: 750 mg via ORAL
  Filled 2015-05-07 (×2): qty 1

## 2015-05-07 NOTE — Progress Notes (Signed)
Subjective: The patient had a fair night. He does have acute on chronic respiratory failure prior history COPD CABG diabetes mellitus and hypertension and chronic kidney disease. He has slight elevation of blood pressure tachycardia-metoprolol was added to regimen and is feeling some better  Objective: Vital signs in last 24 hours: Temp:  [98 F (36.7 C)-98.7 F (37.1 C)] 98.7 F (37.1 C) (07/23 0557) Pulse Rate:  [112-125] 124 (07/23 0557) Resp:  [20] 20 (07/23 0557) BP: (142-165)/(99-114) 148/102 mmHg (07/23 0557) SpO2:  [92 %-98 %] 92 % (07/23 0729) Weight:  [144.8 kg (319 lb 3.6 oz)] 144.8 kg (319 lb 3.6 oz) (07/23 0557) Weight change: 0.8 kg (1 lb 12.2 oz) Last BM Date: 05/06/15  Intake/Output from previous day: 07/22 0701 - 07/23 0700 In: 1113 [P.O.:960; I.V.:3; IV Piggyback:150] Out: 1050 [Urine:1050] Intake/Output this shift:    Physical Exam: Gen. appearance-the patient is alert and oriented-does have some difficulty with his breathing  H EENT negative  Neck supple no JVD or thyroid abnormalities  Heart regular rhythm no murmurs  Lungs diminished breath sounds with occasional rhonchus heard posteriorly  Abdomen no palpable organs or masses  Skin warm and dry  Extremities free of edema  No results for input(s): WBC, HGB, HCT, PLT in the last 72 hours. BMET  Recent Labs  05/07/15 0505  NA 136  K 4.4  CL 106  CO2 18*  GLUCOSE 284*  BUN 54*  CREATININE 1.59*  CALCIUM 7.9*    Studies/Results: No results found.  Medications:  . antiseptic oral rinse  7 mL Mouth Rinse BID  . aspirin EC  325 mg Oral Daily  . budesonide-formoterol  2 puff Inhalation BID  . heparin  5,000 Units Subcutaneous 3 times per day  . insulin aspart  0-15 Units Subcutaneous TID WC  . insulin aspart  0-5 Units Subcutaneous QHS  . insulin aspart  10 Units Subcutaneous TID WC  . insulin glargine  25 Units Subcutaneous BID  . levalbuterol  0.63 mg Nebulization TID  .  levofloxacin (LEVAQUIN) IV  750 mg Intravenous Q48H  . lisinopril  10 mg Oral Daily  . methylPREDNISolone (SOLU-MEDROL) injection  60 mg Intravenous Q6H  . metoprolol tartrate  50 mg Oral BID  . simvastatin  40 mg Oral Daily  . sodium chloride  3 mL Intravenous Q12H  . sodium chloride  3 mL Intravenous Q12H  . tiotropium  18 mcg Inhalation Daily        Assessment/Plan: 1. COPD acute respiratory failure with some improvement-continue IV Levaquin pending continue nebulizer treatments and Solu-Medrol  2. Essential hypertension continue to monitor admitted metoprolol to regimen  3. Diabetes mellitus without complications continue Lantus insulin and short-acting insulin   LOS: 4 days   Rhegan Trunnell G 05/07/2015, 8:23 AM

## 2015-05-07 NOTE — Progress Notes (Signed)
Subjective: He says he feels better but is still short of breath and wheezing when he ambulates. He has no new complaints.  Objective: Vital signs in last 24 hours: Temp:  [98 F (36.7 C)-98.7 F (37.1 C)] 98.7 F (37.1 C) (07/23 0557) Pulse Rate:  [112-125] 124 (07/23 0557) Resp:  [20] 20 (07/23 0557) BP: (142-165)/(99-114) 148/102 mmHg (07/23 0557) SpO2:  [92 %-98 %] 92 % (07/23 0729) Weight:  [144.8 kg (319 lb 3.6 oz)] 144.8 kg (319 lb 3.6 oz) (07/23 0557) Weight change: 0.8 kg (1 lb 12.2 oz) Last BM Date: 05/06/15  Intake/Output from previous day: 07/22 0701 - 07/23 0700 In: 1113 [P.O.:960; I.V.:3; IV Piggyback:150] Out: 1050 [Urine:1050]  PHYSICAL EXAM General appearance: alert, cooperative, mild distress and morbidly obese Resp: rhonchi bilaterally Cardio: regular rate and rhythm, S1, S2 normal, no murmur, click, rub or gallop GI: soft, non-tender; bowel sounds normal; no masses,  no organomegaly Extremities: extremities normal, atraumatic, no cyanosis or edema  Lab Results:  Results for orders placed or performed during the hospital encounter of 05/03/15 (from the past 48 hour(s))  Glucose, capillary     Status: Abnormal   Collection Time: 05/05/15 11:33 AM  Result Value Ref Range   Glucose-Capillary 290 (H) 65 - 99 mg/dL   Comment 1 Notify RN   Glucose, capillary     Status: Abnormal   Collection Time: 05/05/15  4:54 PM  Result Value Ref Range   Glucose-Capillary 266 (H) 65 - 99 mg/dL   Comment 1 Notify RN   Glucose, capillary     Status: Abnormal   Collection Time: 05/05/15 10:17 PM  Result Value Ref Range   Glucose-Capillary 267 (H) 65 - 99 mg/dL   Comment 1 Notify RN    Comment 2 Document in Chart   Glucose, capillary     Status: Abnormal   Collection Time: 05/06/15  7:36 AM  Result Value Ref Range   Glucose-Capillary 313 (H) 65 - 99 mg/dL  Glucose, capillary     Status: Abnormal   Collection Time: 05/06/15 11:27 AM  Result Value Ref Range   Glucose-Capillary 303 (H) 65 - 99 mg/dL  Glucose, capillary     Status: Abnormal   Collection Time: 05/06/15  4:52 PM  Result Value Ref Range   Glucose-Capillary 277 (H) 65 - 99 mg/dL   Comment 1 Notify RN    Comment 2 Document in Chart   Glucose, capillary     Status: Abnormal   Collection Time: 05/06/15  8:28 PM  Result Value Ref Range   Glucose-Capillary 239 (H) 65 - 99 mg/dL   Comment 1 Notify RN    Comment 2 Document in Chart   Basic metabolic panel     Status: Abnormal   Collection Time: 05/07/15  5:05 AM  Result Value Ref Range   Sodium 136 135 - 145 mmol/L   Potassium 4.4 3.5 - 5.1 mmol/L   Chloride 106 101 - 111 mmol/L   CO2 18 (L) 22 - 32 mmol/L   Glucose, Bld 284 (H) 65 - 99 mg/dL   BUN 54 (H) 6 - 20 mg/dL   Creatinine, Ser 1.59 (H) 0.61 - 1.24 mg/dL   Calcium 7.9 (L) 8.9 - 10.3 mg/dL   GFR calc non Af Amer 45 (L) >60 mL/min   GFR calc Af Amer 52 (L) >60 mL/min    Comment: (NOTE) The eGFR has been calculated using the CKD EPI equation. This calculation has not been validated in all  clinical situations. eGFR's persistently <60 mL/min signify possible Chronic Kidney Disease.    Anion gap 12 5 - 15  Glucose, capillary     Status: Abnormal   Collection Time: 05/07/15  7:23 AM  Result Value Ref Range   Glucose-Capillary 281 (H) 65 - 99 mg/dL   Comment 1 Notify RN     ABGS No results for input(s): PHART, PO2ART, TCO2, HCO3 in the last 72 hours.  Invalid input(s): PCO2 CULTURES No results found for this or any previous visit (from the past 240 hour(s)). Studies/Results: No results found.  Medications:  Prior to Admission:  Prescriptions prior to admission  Medication Sig Dispense Refill Last Dose  . aspirin EC 81 MG tablet Take 81 mg by mouth daily.   05/03/2015 at Unknown time  . HYDROcodone-acetaminophen (NORCO) 7.5-325 MG per tablet Take 1 tablet by mouth every 4 (four) hours as needed for moderate pain or severe pain.   0 05/03/2015 at Unknown time  .  insulin aspart (NOVOLOG) 100 UNIT/ML FlexPen Inject 20 Units into the skin 3 (three) times daily with meals. Sliding scale   05/03/2015 at Unknown time  . insulin glargine (LANTUS) 100 unit/mL SOPN Inject 20-28 Units into the skin 2 (two) times daily. 28 units in the morning and 20 units before bed   05/02/2015 at Unknown time  . lisinopril (PRINIVIL,ZESTRIL) 10 MG tablet Take 10 mg by mouth daily.   unknown at Unknown time  . simvastatin (ZOCOR) 40 MG tablet Take 40 mg by mouth daily.  3 05/02/2015 at Unknown time  . oxyCODONE-acetaminophen (PERCOCET/ROXICET) 5-325 MG per tablet Take 1 tablet by mouth every 6 (six) hours as needed. 20 tablet 0    Scheduled: . antiseptic oral rinse  7 mL Mouth Rinse BID  . aspirin EC  325 mg Oral Daily  . budesonide-formoterol  2 puff Inhalation BID  . heparin  5,000 Units Subcutaneous 3 times per day  . insulin aspart  0-15 Units Subcutaneous TID WC  . insulin aspart  0-5 Units Subcutaneous QHS  . insulin aspart  10 Units Subcutaneous TID WC  . insulin glargine  25 Units Subcutaneous BID  . levalbuterol  0.63 mg Nebulization TID  . [START ON 05/08/2015] levofloxacin  750 mg Oral Q48H  . lisinopril  10 mg Oral Daily  . methylPREDNISolone (SOLU-MEDROL) injection  60 mg Intravenous Q6H  . metoprolol tartrate  50 mg Oral BID  . simvastatin  40 mg Oral Daily  . sodium chloride  3 mL Intravenous Q12H  . sodium chloride  3 mL Intravenous Q12H  . tiotropium  18 mcg Inhalation Daily   Continuous:  QIO:NGEXBM chloride, acetaminophen **OR** acetaminophen, alum & mag hydroxide-simeth, guaiFENesin-dextromethorphan, HYDROcodone-acetaminophen, levalbuterol, magnesium hydroxide, ondansetron **OR** ondansetron (ZOFRAN) IV, sodium chloride, sorbitol  Assesment: He is admitted with acute hypoxic respiratory failure. He has COPD at baseline. He had chest pain that seems to be noncardiac. He does have a history of coronary artery disease. Principal Problem:   Acute respiratory  failure with hypoxia Active Problems:   COPD (chronic obstructive pulmonary disease)   COPD with exacerbation   Sinus tachycardia   Diabetes mellitus without complication   Hypertension   Coronary artery disease   Chest pain   CKD (chronic kidney disease)   Obesity   Acute respiratory failure    Plan: He will continue current treatments. He is being switched to oral antibiotic by pharmacy protocol. He is still on IV steroids. I don't know his baseline so  I'm not sure if he is approaching baseline now.    LOS: 4 days   Nasier Thumm L 05/07/2015, 10:00 AM

## 2015-05-07 NOTE — Progress Notes (Signed)
PHARMACIST - PHYSICIAN COMMUNICATION DR:    CONCERNING: Antibiotic IV to Oral Route Change Policy  RECOMMENDATION: This patient is receiving Levaquin by the intravenous route.  Based on criteria approved by the Pharmacy and Therapeutics Committee, the antibiotic(s) is/are being converted to the equivalent oral dose form(s).   DESCRIPTION: These criteria include:  Patient being treated for a respiratory tract infection, urinary tract infection, cellulitis or clostridium difficile associated diarrhea if on metronidazole  The patient is not neutropenic and does not exhibit a GI malabsorption state  The patient is eating (either orally or via tube) and/or has been taking other orally administered medications for a least 24 hours  The patient is improving clinically and has a Tmax < 100.5  If you have questions about this conversion, please contact the Pharmacy Department  [x]  ( [phone] )  [place] []  ( [phone]  [facility] []  ( [phone]  [place] []  ( [phone]  [facility] []  ( [phone] )  [facility]  

## 2015-05-08 LAB — GLUCOSE, CAPILLARY
GLUCOSE-CAPILLARY: 223 mg/dL — AB (ref 65–99)
GLUCOSE-CAPILLARY: 220 mg/dL — AB (ref 65–99)
Glucose-Capillary: 330 mg/dL — ABNORMAL HIGH (ref 65–99)
Glucose-Capillary: 172 mg/dL — ABNORMAL HIGH (ref 65–99)

## 2015-05-08 MED ORDER — PREDNISONE 20 MG PO TABS
40.0000 mg | ORAL_TABLET | Freq: Every day | ORAL | Status: DC
  Administered 2015-05-08 – 2015-05-14 (×7): 40 mg via ORAL
  Filled 2015-05-08 (×7): qty 2

## 2015-05-08 NOTE — Progress Notes (Signed)
Subjective: He says he feels better. He has no new complaints. His breathing is doing pretty well.  Objective: Vital signs in last 24 hours: Temp:  [98.2 F (36.8 C)-98.4 F (36.9 C)] 98.2 F (36.8 C) (07/24 0459) Pulse Rate:  [104-123] 104 (07/24 0459) Resp:  [20] 20 (07/24 0459) BP: (138-149)/(84-112) 142/88 mmHg (07/24 0459) SpO2:  [93 %-95 %] 93 % (07/24 0747) Weight:  [144.6 kg (318 lb 12.6 oz)] 144.6 kg (318 lb 12.6 oz) (07/24 0459) Weight change: -0.2 kg (-7.1 oz) Last BM Date: 05/07/15  Intake/Output from previous day: 07/23 0701 - 07/24 0700 In: 960 [P.O.:960] Out: -   PHYSICAL EXAM General appearance: alert, cooperative and no distress Resp: clear to auscultation bilaterally Cardio: regular rate and rhythm, S1, S2 normal, no murmur, click, rub or gallop GI: soft, non-tender; bowel sounds normal; no masses,  no organomegaly Extremities: extremities normal, atraumatic, no cyanosis or edema  Lab Results:  Results for orders placed or performed during the hospital encounter of 05/03/15 (from the past 48 hour(s))  Glucose, capillary     Status: Abnormal   Collection Time: 05/06/15 11:27 AM  Result Value Ref Range   Glucose-Capillary 303 (H) 65 - 99 mg/dL  Glucose, capillary     Status: Abnormal   Collection Time: 05/06/15  4:52 PM  Result Value Ref Range   Glucose-Capillary 277 (H) 65 - 99 mg/dL   Comment 1 Notify RN    Comment 2 Document in Chart   Glucose, capillary     Status: Abnormal   Collection Time: 05/06/15  8:28 PM  Result Value Ref Range   Glucose-Capillary 239 (H) 65 - 99 mg/dL   Comment 1 Notify RN    Comment 2 Document in Chart   Basic metabolic panel     Status: Abnormal   Collection Time: 05/07/15  5:05 AM  Result Value Ref Range   Sodium 136 135 - 145 mmol/L   Potassium 4.4 3.5 - 5.1 mmol/L   Chloride 106 101 - 111 mmol/L   CO2 18 (L) 22 - 32 mmol/L   Glucose, Bld 284 (H) 65 - 99 mg/dL   BUN 54 (H) 6 - 20 mg/dL   Creatinine, Ser 1.59 (H)  0.61 - 1.24 mg/dL   Calcium 7.9 (L) 8.9 - 10.3 mg/dL   GFR calc non Af Amer 45 (L) >60 mL/min   GFR calc Af Amer 52 (L) >60 mL/min    Comment: (NOTE) The eGFR has been calculated using the CKD EPI equation. This calculation has not been validated in all clinical situations. eGFR's persistently <60 mL/min signify possible Chronic Kidney Disease.    Anion gap 12 5 - 15  Glucose, capillary     Status: Abnormal   Collection Time: 05/07/15  7:23 AM  Result Value Ref Range   Glucose-Capillary 281 (H) 65 - 99 mg/dL   Comment 1 Notify RN   Glucose, capillary     Status: Abnormal   Collection Time: 05/07/15 11:13 AM  Result Value Ref Range   Glucose-Capillary 303 (H) 65 - 99 mg/dL   Comment 1 Notify RN   Glucose, capillary     Status: Abnormal   Collection Time: 05/07/15  4:37 PM  Result Value Ref Range   Glucose-Capillary 245 (H) 65 - 99 mg/dL   Comment 1 Notify RN   Glucose, capillary     Status: Abnormal   Collection Time: 05/07/15  8:52 PM  Result Value Ref Range   Glucose-Capillary 267 (H)  65 - 99 mg/dL   Comment 1 Notify RN    Comment 2 Document in Chart   Glucose, capillary     Status: Abnormal   Collection Time: 05/08/15  7:30 AM  Result Value Ref Range   Glucose-Capillary 223 (H) 65 - 99 mg/dL   Comment 1 Notify RN     ABGS No results for input(s): PHART, PO2ART, TCO2, HCO3 in the last 72 hours.  Invalid input(s): PCO2 CULTURES No results found for this or any previous visit (from the past 240 hour(s)). Studies/Results: No results found.  Medications:  Prior to Admission:  Prescriptions prior to admission  Medication Sig Dispense Refill Last Dose  . aspirin EC 81 MG tablet Take 81 mg by mouth daily.   05/03/2015 at Unknown time  . HYDROcodone-acetaminophen (NORCO) 7.5-325 MG per tablet Take 1 tablet by mouth every 4 (four) hours as needed for moderate pain or severe pain.   0 05/03/2015 at Unknown time  . insulin aspart (NOVOLOG) 100 UNIT/ML FlexPen Inject 20 Units  into the skin 3 (three) times daily with meals. Sliding scale   05/03/2015 at Unknown time  . insulin glargine (LANTUS) 100 unit/mL SOPN Inject 20-28 Units into the skin 2 (two) times daily. 28 units in the morning and 20 units before bed   05/02/2015 at Unknown time  . lisinopril (PRINIVIL,ZESTRIL) 10 MG tablet Take 10 mg by mouth daily.   unknown at Unknown time  . simvastatin (ZOCOR) 40 MG tablet Take 40 mg by mouth daily.  3 05/02/2015 at Unknown time  . oxyCODONE-acetaminophen (PERCOCET/ROXICET) 5-325 MG per tablet Take 1 tablet by mouth every 6 (six) hours as needed. 20 tablet 0    Scheduled: . antiseptic oral rinse  7 mL Mouth Rinse BID  . aspirin EC  325 mg Oral Daily  . budesonide-formoterol  2 puff Inhalation BID  . heparin  5,000 Units Subcutaneous 3 times per day  . insulin aspart  0-15 Units Subcutaneous TID WC  . insulin aspart  0-5 Units Subcutaneous QHS  . insulin aspart  10 Units Subcutaneous TID WC  . insulin glargine  25 Units Subcutaneous BID  . levalbuterol  0.63 mg Nebulization TID  . levofloxacin  750 mg Oral Q48H  . lisinopril  10 mg Oral Daily  . methylPREDNISolone (SOLU-MEDROL) injection  60 mg Intravenous Q6H  . metoprolol tartrate  50 mg Oral BID  . simvastatin  40 mg Oral Daily  . sodium chloride  3 mL Intravenous Q12H  . tiotropium  18 mcg Inhalation Daily   Continuous:  JKK:XFGHWE chloride, acetaminophen **OR** acetaminophen, alum & mag hydroxide-simeth, guaiFENesin-dextromethorphan, HYDROcodone-acetaminophen, levalbuterol, magnesium hydroxide, ondansetron **OR** ondansetron (ZOFRAN) IV, sodium chloride, sorbitol  Assesment: He was admitted with acute hypoxic respiratory failure and has COPD exacerbation. He is improving. He doesn't have any wheezes now. I think he is okay to switch to prednisone. Principal Problem:   Acute respiratory failure with hypoxia Active Problems:   COPD (chronic obstructive pulmonary disease)   COPD with exacerbation   Sinus  tachycardia   Diabetes mellitus without complication   Hypertension   Coronary artery disease   Chest pain   CKD (chronic kidney disease)   Obesity   Acute respiratory failure    Plan: Switch to prednisone. Continue with his other treatments.    LOS: 5 days   Vina Byrd L 05/08/2015, 9:50 AM

## 2015-05-08 NOTE — Progress Notes (Signed)
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 rate 20, height  (1.854 m), weight 318 lb 12.6 oz (144.6 kg), SpO2 95 %. lungs show prolonged intrauterine phase mild end expiratory wheeze no rales appreciable heart regular rhythm no S3-S4 no heaves thrills rubs abdomen obese soft no detectable organomegaly   Investigations:  No results found for this or any previous visit (from the past 240 hour(s)).   Basic Metabolic Panel:  Recent Labs  81/19/14 0505  NA 136  K 4.4  CL 106  CO2 18*  GLUCOSE 284*  BUN 54*  CREATININE 1.59*  CALCIUM 7.9*   Liver Function Tests: No results for input(s): AST, ALT, ALKPHOS, BILITOT, PROT, ALBUMIN in the last 72 hours.   CBC: No results for input(s): WBC, NEUTROABS, HGB, HCT, MCV, PLT in the last 72 hours.  No results found.    Medications:   Impression: Bioprosthetic aortic valve replacement CABG  Principal Problem:   Acute respiratory failure with hypoxia Active Problems:   COPD (chronic obstructive pulmonary disease)   COPD with exacerbation   Sinus tachycardia   Diabetes mellitus without complication   Hypertension   Coronary artery disease   Chest pain   CKD (chronic kidney disease)   Obesity   Acute respiratory failure     Plan: Continue and wean Solu-Medrol as per pulmonary continue Xopenex nebulizer encourage ambulation continue IV Levaquin   Consultants: Pulmonary   Procedures   Antibiotics: Levaquin                  Code  Status: Full  Family Communication:  Spoke with patient at length  Disposition Plan see plan above  Time spent: 30 minutes   LOS: 5 days   Calli Bashor M   05/08/2015, 7:46 AM

## 2015-05-09 ENCOUNTER — Inpatient Hospital Stay (HOSPITAL_COMMUNITY): Payer: Medicaid Other

## 2015-05-09 LAB — GLUCOSE, CAPILLARY
GLUCOSE-CAPILLARY: 114 mg/dL — AB (ref 65–99)
GLUCOSE-CAPILLARY: 165 mg/dL — AB (ref 65–99)
GLUCOSE-CAPILLARY: 139 mg/dL — AB (ref 65–99)
Glucose-Capillary: 160 mg/dL — ABNORMAL HIGH (ref 65–99)

## 2015-05-09 LAB — TROPONIN I
TROPONIN I: 0.04 ng/mL — AB (ref <0.031)
Troponin I: 0.04 ng/mL — ABNORMAL HIGH (ref <0.031)

## 2015-05-09 LAB — BRAIN NATRIURETIC PEPTIDE: B NATRIURETIC PEPTIDE 5: 81 pg/mL (ref 0.0–100.0)

## 2015-05-09 NOTE — Care Management Note (Signed)
Case Management Note  Patient Details  Name: TYLEK BONEY MRN: [id number] Date of Birth: 1951-04-18  Subjective/Objective:                    Action/Plan:   Expected Discharge Date:                  Expected Discharge Plan:  Home/Self Care  In-House Referral:  NA  Discharge planning Services  CM Consult  Post Acute Care Choice:  NA Choice offered to:  NA  DME Arranged:  Nebulizer machine DME Agency:  [facility]  HH Arranged:    [facility] Agency:     Status of Service:  Completed, signed off  Medicare Important Message Given:    Date Medicare IM Given:    Medicare IM give by:    Date Additional Medicare IM Given:    Additional Medicare Important Message give by:     If discussed at Long Length of Stay Meetings, dates discussed:    Additional Comments: Pt still having SOB with tachycardia. Awaiting CXR, EKG, and CXR results. Spoke with pt about need to ambulate and move about. Arlyss Queen [place], RN 05/09/2015, 10:04 AM

## 2015-05-09 NOTE — Progress Notes (Signed)
Order received for Cardiology consult, [facility] [place] office called, I spoke with Dahlia Client, MD to be notified. Will continue to monitor patient.

## 2015-05-09 NOTE — Progress Notes (Signed)
Subjective: The patient had a fairly good night. He does continue to have some dyspnea. He does have prior history COPD CABG diabetes mellitus and hypertension and chronic kidney disease. Is feeling some better but still is concerned about his breathing  Objective: Vital signs in last 24 hours: Temp:  [98.1 F (36.7 C)-98.5 F (36.9 C)] 98.5 F (36.9 C) (07/25 0548) Pulse Rate:  [120-125] 120 (07/25 0548) Resp:  [20] 20 (07/25 0548) BP: (92-174)/(76-110) 119/101 mmHg (07/25 0548) SpO2:  [93 %-96 %] 95 % (07/25 0548) Weight:  [143.32 kg (315 lb 15.4 oz)] 143.32 kg (315 lb 15.4 oz) (07/25 0548) Weight change: -1.28 kg (-2 lb 13.2 oz) Last BM Date: 05/08/15  Intake/Output from previous day: 07/24 0701 - 07/25 0700 In: 720 [P.O.:720] Out: -  Intake/Output this shift:    Physical Exam: Gen. appearance-the patient is alert and oriented comfortable with his breathing at present  H EENT negative  Neck supple no JVD or thyroid abnormalities  Heart regular rhythm no murmurs  Lungs diminished breath sounds  Abdomen the palpable organs or masses  Skin warm and dry  Extremities free of edema  No results for input(s): WBC, HGB, HCT, PLT in the last 72 hours. BMET  Recent Labs  05/07/15 0505  NA 136  K 4.4  CL 106  CO2 18*  GLUCOSE 284*  BUN 54*  CREATININE 1.59*  CALCIUM 7.9*    Studies/Results: No results found.  Medications:  . antiseptic oral rinse  7 mL Mouth Rinse BID  . aspirin EC  325 mg Oral Daily  . budesonide-formoterol  2 puff Inhalation BID  . heparin  5,000 Units Subcutaneous 3 times per day  . insulin aspart  0-15 Units Subcutaneous TID WC  . insulin aspart  0-5 Units Subcutaneous QHS  . insulin aspart  10 Units Subcutaneous TID WC  . insulin glargine  25 Units Subcutaneous BID  . levalbuterol  0.63 mg Nebulization TID  . levofloxacin  750 mg Oral Q48H  . lisinopril  10 mg Oral Daily  . metoprolol tartrate  50 mg Oral BID  . predniSONE  40 mg  Oral Q breakfast  . simvastatin  40 mg Oral Daily  . sodium chloride  3 mL Intravenous Q12H  . tiotropium  18 mcg Inhalation Daily        Assessment/Plan: 1. COPD acute respiratory failure with some improvement plan to continue IV Levaquin nebulizer treatments and prednisone by mouth  2. Essential hypertension continue to monitor continue metoprolol 50 mg twice a day  3. Diabetes mellitus without complications continue Lantus insulin and short-acting insulin   LOS: 6 days   Raylee Adamec G 05/09/2015, 6:06 AM

## 2015-05-09 NOTE — Progress Notes (Signed)
Subjective: He is still short of breath. He has been having trouble with tachycardia. He has significant difficulty in getting up and moving around.  Objective: Vital signs in last 24 hours: Temp:  [98.1 F (36.7 C)-98.5 F (36.9 C)] 98.5 F (36.9 C) (07/25 0548) Pulse Rate:  [120-125] 120 (07/25 0548) Resp:  [20] 20 (07/25 0548) BP: (92-174)/(76-110) 119/101 mmHg (07/25 0548) SpO2:  [93 %-96 %] 95 % (07/25 0731) Weight:  [143.32 kg (315 lb 15.4 oz)] 143.32 kg (315 lb 15.4 oz) (07/25 0548) Weight change: -1.28 kg (-2 lb 13.2 oz) Last BM Date: 05/08/15  Intake/Output from previous day: 07/24 0701 - 07/25 0700 In: 720 [P.O.:720] Out: -   PHYSICAL EXAM General appearance: alert, cooperative and mild distress Resp: rhonchi bilaterally Cardio: His heart is regular but tachycardic GI: soft, non-tender; bowel sounds normal; no masses,  no organomegaly Extremities: extremities normal, atraumatic, no cyanosis or edema  Lab Results:  Results for orders placed or performed during the hospital encounter of 05/03/15 (from the past 48 hour(s))  Glucose, capillary     Status: Abnormal   Collection Time: 05/07/15 11:13 AM  Result Value Ref Range   Glucose-Capillary 303 (H) 65 - 99 mg/dL   Comment 1 Notify RN   Glucose, capillary     Status: Abnormal   Collection Time: 05/07/15  4:37 PM  Result Value Ref Range   Glucose-Capillary 245 (H) 65 - 99 mg/dL   Comment 1 Notify RN   Glucose, capillary     Status: Abnormal   Collection Time: 05/07/15  8:52 PM  Result Value Ref Range   Glucose-Capillary 267 (H) 65 - 99 mg/dL   Comment 1 Notify RN    Comment 2 Document in Chart   Glucose, capillary     Status: Abnormal   Collection Time: 05/08/15  7:30 AM  Result Value Ref Range   Glucose-Capillary 223 (H) 65 - 99 mg/dL   Comment 1 Notify RN   Glucose, capillary     Status: Abnormal   Collection Time: 05/08/15 11:27 AM  Result Value Ref Range   Glucose-Capillary 330 (H) 65 - 99 mg/dL   Comment 1 Notify RN   Glucose, capillary     Status: Abnormal   Collection Time: 05/08/15  4:08 PM  Result Value Ref Range   Glucose-Capillary 220 (H) 65 - 99 mg/dL   Comment 1 Notify RN    Comment 2 Document in Chart   Glucose, capillary     Status: Abnormal   Collection Time: 05/08/15  8:38 PM  Result Value Ref Range   Glucose-Capillary 172 (H) 65 - 99 mg/dL   Comment 1 Notify RN    Comment 2 Document in Chart   Glucose, capillary     Status: Abnormal   Collection Time: 05/09/15  7:28 AM  Result Value Ref Range   Glucose-Capillary 114 (H) 65 - 99 mg/dL   Comment 1 Notify RN    Comment 2 Document in Chart     ABGS No results for input(s): PHART, PO2ART, TCO2, HCO3 in the last 72 hours.  Invalid input(s): PCO2 CULTURES No results found for this or any previous visit (from the past 240 hour(s)). Studies/Results: No results found.  Medications:  Prior to Admission:  Prescriptions prior to admission  Medication Sig Dispense Refill Last Dose  . aspirin EC 81 MG tablet Take 81 mg by mouth daily.   05/03/2015 at Unknown time  . HYDROcodone-acetaminophen (NORCO) 7.5-325 MG per tablet Take 1  tablet by mouth every 4 (four) hours as needed for moderate pain or severe pain.   0 05/03/2015 at Unknown time  . insulin aspart (NOVOLOG) 100 UNIT/ML FlexPen Inject 20 Units into the skin 3 (three) times daily with meals. Sliding scale   05/03/2015 at Unknown time  . insulin glargine (LANTUS) 100 unit/mL SOPN Inject 20-28 Units into the skin 2 (two) times daily. 28 units in the morning and 20 units before bed   05/02/2015 at Unknown time  . lisinopril (PRINIVIL,ZESTRIL) 10 MG tablet Take 10 mg by mouth daily.   unknown at Unknown time  . simvastatin (ZOCOR) 40 MG tablet Take 40 mg by mouth daily.  3 05/02/2015 at Unknown time  . oxyCODONE-acetaminophen (PERCOCET/ROXICET) 5-325 MG per tablet Take 1 tablet by mouth every 6 (six) hours as needed. 20 tablet 0    Scheduled: . antiseptic oral rinse  7  mL Mouth Rinse BID  . aspirin EC  325 mg Oral Daily  . budesonide-formoterol  2 puff Inhalation BID  . heparin  5,000 Units Subcutaneous 3 times per day  . insulin aspart  0-15 Units Subcutaneous TID WC  . insulin aspart  0-5 Units Subcutaneous QHS  . insulin aspart  10 Units Subcutaneous TID WC  . insulin glargine  25 Units Subcutaneous BID  . levalbuterol  0.63 mg Nebulization TID  . levofloxacin  750 mg Oral Q48H  . lisinopril  10 mg Oral Daily  . metoprolol tartrate  50 mg Oral BID  . predniSONE  40 mg Oral Q breakfast  . simvastatin  40 mg Oral Daily  . sodium chloride  3 mL Intravenous Q12H  . tiotropium  18 mcg Inhalation Daily   Continuous:  ZOX:WRUEAV chloride, acetaminophen **OR** acetaminophen, alum & mag hydroxide-simeth, guaiFENesin-dextromethorphan, HYDROcodone-acetaminophen, levalbuterol, magnesium hydroxide, ondansetron **OR** ondansetron (ZOFRAN) IV, sodium chloride, sorbitol  Assesment: He is admitted with acute hypoxic respiratory failure. He has COPD exacerbation. He has tachycardia and is not totally clear if this is sinus or not. There is a mention of potential atrial flutter with 2:1 block on his echocardiogram report. Principal Problem:   Acute respiratory failure with hypoxia Active Problems:   COPD (chronic obstructive pulmonary disease)   COPD with exacerbation   Sinus tachycardia   Diabetes mellitus without complication   Hypertension   Coronary artery disease   Chest pain   CKD (chronic kidney disease)   Obesity   Acute respiratory failure    Plan:Metoprolol may not be the best choice for his blood pressure control but he does have tachycardia. Since he has issues still with breathing I think we need to check BNP, EKG and see what rhythm he is in and he may need cardiology consultation.   LOS: 6 days   Laurena Valko L 05/09/2015, 8:30 AM

## 2015-05-10 ENCOUNTER — Encounter (HOSPITAL_COMMUNITY): Payer: Self-pay | Admitting: Adult Health

## 2015-05-10 DIAGNOSIS — I4892 Unspecified atrial flutter: Secondary | ICD-10-CM

## 2015-05-10 LAB — GLUCOSE, CAPILLARY
GLUCOSE-CAPILLARY: 111 mg/dL — AB (ref 65–99)
GLUCOSE-CAPILLARY: 195 mg/dL — AB (ref 65–99)
GLUCOSE-CAPILLARY: 216 mg/dL — AB (ref 65–99)
Glucose-Capillary: 130 mg/dL — ABNORMAL HIGH (ref 65–99)

## 2015-05-10 LAB — MAGNESIUM: Magnesium: 2.5 mg/dL — ABNORMAL HIGH (ref 1.7–2.4)

## 2015-05-10 LAB — BASIC METABOLIC PANEL
Anion gap: 10 (ref 5–15)
BUN: 47 mg/dL — ABNORMAL HIGH (ref 6–20)
CO2: 20 mmol/L — ABNORMAL LOW (ref 22–32)
Calcium: 7.7 mg/dL — ABNORMAL LOW (ref 8.9–10.3)
Chloride: 105 mmol/L (ref 101–111)
Creatinine, Ser: 1.48 mg/dL — ABNORMAL HIGH (ref 0.61–1.24)
GFR calc Af Amer: 56 mL/min — ABNORMAL LOW (ref >60)
GFR calc non Af Amer: 49 mL/min — ABNORMAL LOW (ref >60)
Glucose, Bld: 106 mg/dL — ABNORMAL HIGH (ref 65–99)
Potassium: 4.1 mmol/L (ref 3.5–5.1)
Sodium: 135 mmol/L (ref 135–145)

## 2015-05-10 LAB — TROPONIN I: TROPONIN I: 0.05 ng/mL — AB (ref <0.031)

## 2015-05-10 MED ORDER — LEVOFLOXACIN 750 MG PO TABS
750.0000 mg | ORAL_TABLET | Freq: Every day | ORAL | Status: DC
  Administered 2015-05-10 – 2015-05-14 (×5): 750 mg via ORAL
  Filled 2015-05-10 (×5): qty 1

## 2015-05-10 MED ORDER — ASPIRIN EC 81 MG PO TBEC
81.0000 mg | DELAYED_RELEASE_TABLET | Freq: Every day | ORAL | Status: DC
  Administered 2015-05-11 – 2015-05-14 (×4): 81 mg via ORAL
  Filled 2015-05-10 (×5): qty 1

## 2015-05-10 MED ORDER — ATORVASTATIN CALCIUM 20 MG PO TABS
20.0000 mg | ORAL_TABLET | Freq: Every day | ORAL | Status: DC
  Administered 2015-05-10 – 2015-05-13 (×4): 20 mg via ORAL
  Filled 2015-05-10 (×4): qty 1

## 2015-05-10 MED ORDER — DILTIAZEM HCL 60 MG PO TABS
60.0000 mg | ORAL_TABLET | Freq: Three times a day (TID) | ORAL | Status: DC
  Administered 2015-05-10 – 2015-05-11 (×3): 60 mg via ORAL
  Filled 2015-05-10 (×3): qty 1

## 2015-05-10 NOTE — Consult Note (Signed)
CARDIOLOGY CONSULT NOTE   Patient ID: Vincent Kaufman MRN: [id number] DOB/AGE: 06/10/1951 64 y.o.  Admit Date: 05/03/2015 Referring Physician: Butch Penny MD Primary Physician: Alice Reichert, MD Consulting Cardiologist: Dina Rich MD Primary Cardiologist New (Had been seeing Dr. Kizzie Bane at [facility] since 2009). Reason for Consultation: Cardiac arrhythmias   Clinical Summary Vincent Kaufman is a 64 y.o.male with hx of  Aortic Valve replacement Vincent Kaufman 12/27/2004 with AAA repair),  hypertension, diabetes, CKD, and COPD admitted with history of upper respiratory infection, and worsening dyspnea. . On day of admission he developed left sided chest tightness. He has been treated with solumedrol, Xopenex and Levaquin.   He states he has not been followed by cardiology since 2009, as he got "tired of traveling to [place]." He states that he began to have cold-like symptoms last week, with worsening DOE, diaphoresis, and fatigue. He denies feeling his heart racing or skipping. He states he got to the point the he could not breath at rest, but denies PND or orthopnea. Came to ER for treatment.   On arrival to ER, HR 120 bpm, 139/94, RR-27, O2 Sat 94%. Afebrile. WBC elevated at 11.8; Glucose 250, Creatinine 1.77, Pro-BNP 220. CXR negative for CHF or pneumonia. Treated with morphine, Xopenex, IV steroids, and levaquin.  He has been experiencing tachycardia since admission, and found to have atrial flutter with rate greater than > bpm per telemetry in ER On review of records from [facility], he has been on metoprolol in the past, but is no longer on this. Marland Kitchen He currently is feeling a breathing much better and is without complaints of chest pressure. Troponin 0.04, and 0.05 respectively. We are asked for cardiology recommendations for tachycardia and atrial arrhythmias. He is evasive concerning his health hx.    Allergies  Allergen Reactions  . Penicillins Rash     Medications Scheduled Medications: . aspirin EC  325 mg Oral Daily  . budesonide-formoterol  2 puff Inhalation BID  . heparin  5,000 Units Subcutaneous 3 times per day  . insulin aspart  0-15 Units Subcutaneous TID WC  . insulin aspart  0-5 Units Subcutaneous QHS  . insulin aspart  10 Units Subcutaneous TID WC  . insulin glargine  25 Units Subcutaneous BID  . levalbuterol  0.63 mg Nebulization TID  . levofloxacin  750 mg Oral Daily  . lisinopril  10 mg Oral Daily  . metoprolol tartrate  50 mg Oral BID  . predniSONE  40 mg Oral Q breakfast  . simvastatin  40 mg Oral Daily  . sodium chloride  3 mL Intravenous Q12H  . tiotropium  18 mcg Inhalation Daily    Infusions:    PRN Medications: sodium chloride, acetaminophen **OR** acetaminophen, alum & mag hydroxide-simeth, guaiFENesin-dextromethorphan, HYDROcodone-acetaminophen, levalbuterol, magnesium hydroxide, ondansetron **OR** ondansetron (ZOFRAN) IV, sodium chloride, sorbitol   Past Medical History  Diagnosis Date  . Diabetes mellitus without complication   . Hypertension   . Asthma   . CKD (chronic kidney disease)   . AAA (abdominal aortic aneurysm) 2006    Repair  . Aortic valve stenosis 2006    Past Surgical History  Procedure Laterality Date  . Abdominal aortic aneurysm repair  2006    [facility]  . Aortic valve replacement  2006    [facility]    Family History  Problem Relation Age of Onset  . Rheum arthritis Mother     Social History Vincent Kaufman reports that he has quit smoking. He  does not have any smokeless tobacco history on file. Vincent Kaufman reports that he does not drink alcohol.  Review of Systems Complete review of systems are found to be negative unless outlined in H&P above.  Physical Examination Blood pressure 129/90, pulse 124, temperature 97.7 F (36.5 C), temperature source Oral, resp. rate 20, height 6\' 1"  (1.854 m), weight 318 lb 5.5 oz (144.4 kg), SpO2 95 %.  Intake/Output  Summary (Last 24 hours) at 05/10/15 1103 Last data filed at 05/10/15 0800  Gross per 24 hour  Intake    720 ml  Output      0 ml  Net    720 ml    Telemetry: SR with PAC's.  GEN: Sleepy but responds to questions.  HEENT: Conjunctiva and lids normal, oropharynx clear with moist mucosa. Neck: Supple, no elevated JVP or carotid bruits, no thyromegaly. Lungs: Clear to auscultation, nonlabored breathing at rest. Cardiac: Regular rate and rhythm, no S3 or significant systolic murmur, no pericardial rub. Abdomen: Soft, nontender, no hepatomegaly, bowel sounds present, no guarding or rebound. Extremities: No pitting edema, distal pulses 2+. Skin: Warm and dry. Musculoskeletal: No kyphosis. Neuropsychiatric: Alert and oriented x3, affect grossly appropriate.  Prior Cardiac Testing/Procedures 1. Echocardiogram 7/20/20016 Left ventricle: The cavity size was normal. Wall thickness was increased in a pattern of moderate LVH. Systolic function was normal. The estimated ejection fraction was in the range of 60% to 65%. Although no diagnostic regional wall motion abnormality was identified, this possibility cannot be completely excluded on the basis of this study. The study is not technically sufficient to allow evaluation of LV diastolic function. - Aortic valve: Moderately calcified annulus. Mildly calcified leaflets. Difficult to say that this is not a bioprosthetic aortic valve, although not stated in his history specifically. Mean gradient (S): 18 mm Hg. Peak gradient (S): 31 mm Hg. Peak velocity ratio of LVOT to aortic valve: 0.41. Valve area (Vmax): 1.28 cm^2. - Aortic root: The aortic root was mildly to moderately dilated. - Mitral valve: Calcified annulus. - Left atrium: The atrium was mildly dilated. - Right atrium: Central venous pressure (est): 3 mm Hg. - Tricuspid valve: There was trivial regurgitation. - Pulmonary arteries: Systolic pressure could not be  accurately estimated. - Pericardium, extracardiac: There was no pericardial effusion. ------------------------------------------------------------------------------------------( Below copied from [facility]) Cardiac CT [facility] 09/20/2006 Impression: 1. Status post with replacement of the aortic valve and ascending aorta. Fluid collection surrounding the ascending aortic graft is again identified, stable or perhaps slightly smaller in the interval. There is no evidence of extravasation of the contrast media or active bleeding. 2. Stable pulmonary nodules.  AAA repair with Aortic Valve Replacement 3/152006 [facility] Valve Replacement Valve size 25.0 Normal diameter sinotubular junction (27 mm) and aorta replaced with 28 mm Dacron graft. Aorta approximately 30 mm in diameter at level of distal anastomosis.   Cardiac Cath 12/25/2004 [facility] DIAGNOSTIC SUMMARY Coronary Artery Disease Left Main: normal LAD system: normal LCX system: normal RCA system: normal No significant CAD indicated Right Heart Catheterization Pulmonary Hypertension: Absent Valvular Disease Aortic Regurgitation: Severe Thoracic Aortogram Aortic Regurgitation Grade: 4+   Lab Results  Basic Metabolic Panel:  Recent Labs Lab 05/03/15 1203 05/04/15 0558 05/07/15 0505 05/10/15 0426  NA 143 137 136 135  K 4.5 4.1 4.4 4.1  CL 108 105 106 105  CO2 25 21* 18* 20*  GLUCOSE 250* 363* 284* 106*  BUN 31* 35* 54* 47*  CREATININE 1.77* 1.67* 1.59* 1.48*  CALCIUM 9.0  9.0 7.9* 7.7*    Liver Function Tests:  Recent Labs Lab 05/03/15 1203  AST 44*  ALT 35  ALKPHOS 63  BILITOT 0.3  PROT 6.4*  ALBUMIN 3.9    CBC:  Recent Labs Lab 05/03/15 1203 05/04/15 0558  WBC 11.8* 14.6*  NEUTROABS 8.8*  --   HGB 14.2 14.3  HCT 42.2 42.5  MCV 90.9 89.9  PLT 154 166    Cardiac Enzymes:  Recent Labs Lab 05/03/15 1731 05/03/15 2352 05/09/15 1700 05/09/15 2216 05/10/15 0426  TROPONINI  0.05* 0.04* 0.04* 0.04* 0.05*    Radiology: Dg Chest 2 View  05/09/2015   CLINICAL DATA:  Shortness of breath.  EXAM: CHEST  2 VIEW  COMPARISON:  May 03, 2015.  FINDINGS: Stable cardiomediastinal silhouette. Sternotomy wires are noted status post cardiac valve repair. Right axillary surgical clips are noted. No pneumothorax or pleural effusion is noted. No acute pulmonary disease is noted.  IMPRESSION: No active cardiopulmonary disease.   Electronically Signed   By: Lupita Raider, M.D.   On: 05/09/2015 10:40     ECG:  ST with LVH and peaked T-waves. Rate of 125 bpm, what appears to be flutter waves inferiorly.    Impression and Recommendations  1. Tachycardia: He does not have a history of CAD or CABG, but does have AAA repair and Aortic Valve repair and replacement at [facility] in 2006. He had been on metoprolol and would restart this for HR elevation. His breathing status is improved without wheezes and he no longer has chest pain. Echo has normal EF but unable to evaluate his pulmnoary pressures. It would be reasonable to restart his BB at 25 mg BID.  Will discuss more with Dr. Wyline Mood concerning more invasive evaluation if he feels it to be clinically necessary.   2.Aortic Valve Repair: Vincent Kaufman valve per [facility]. Echo did not reveal significant abnormalities with the valve.   3. COPD exacerbation: Being followed by Dr. Juanetta Gosling. Continue treatment recommendations per his service.   Signed: Bettey Mare. Lawrence NP [facility]  05/10/2015, 11:03 AM Co-Sign MD  Patient seen and discussed with NP Lyman Bishop, I agree with her documentation above. 64 yo male history of AVR and aneurysm repair, HTN, DM, CKD, and COPD admitted with SOB and chest pain. Reports recent URI symptoms and is being treated for COPD exacerbation with improvement in respiratory symptoms. Cardiology is consulted for tachycardia.   K 4.1, Cr 1.48, BUN 47, BNP 81, trop 0.04-0.05 and flat since admission 1 week ago. TSH 1.7, Mg  pending CXR no acute process 05/04/15 echo: LVEF 60-65%, mild gradient across AV EKG aflutter  New diagnosis of aflutter, patient is on metoprolol  bid without controlled rates. From pulm notes there are some concerns about his beta blocker and his COPD, especially potential further titration. Will stop lopressor, start dilt  po q 8 hrs with hold parameters. CHADS2Vasc score is 2(HTN,DM2), will start eliquis  bid.           Dominga Ferry MD

## 2015-05-10 NOTE — Progress Notes (Signed)
Subjective: He says he feels a little better. His EKG yesterday showed what appears to be atrial flutter. He has still had some episodes of tachycardia. He says his breathing is a little better  Objective: Vital signs in last 24 hours: Temp:  [97.7 F (36.5 C)-98.8 F (37.1 C)] 97.7 F (36.5 C) (07/26 0537) Pulse Rate:  [123-125] 124 (07/26 0537) Resp:  [20] 20 (07/26 0537) BP: (116-133)/(90-96) 129/90 mmHg (07/26 0537) SpO2:  [94 %-99 %] 95 % (07/26 0722) Weight:  [144.4 kg (318 lb 5.5 oz)] 144.4 kg (318 lb 5.5 oz) (07/26 0537) Weight change: 1.08 kg (2 lb 6.1 oz) Last BM Date: 05/09/15  Intake/Output from previous day: 07/25 0701 - 07/26 0700 In: 720 [P.O.:720] Out: -   PHYSICAL EXAM General appearance: alert, cooperative, mild distress and morbidly obese Resp: rhonchi bilaterally Cardio: regular rate and rhythm, S1, S2 normal, no murmur, click, rub or gallop GI: soft, non-tender; bowel sounds normal; no masses,  no organomegaly Extremities: extremities normal, atraumatic, no cyanosis or edema  Lab Results:  Results for orders placed or performed during the hospital encounter of 05/03/15 (from the past 48 hour(s))  Glucose, capillary     Status: Abnormal   Collection Time: 05/08/15 11:27 AM  Result Value Ref Range   Glucose-Capillary 330 (H) 65 - 99 mg/dL   Comment 1 Notify RN   Glucose, capillary     Status: Abnormal   Collection Time: 05/08/15  4:08 PM  Result Value Ref Range   Glucose-Capillary 220 (H) 65 - 99 mg/dL   Comment 1 Notify RN    Comment 2 Document in Chart   Glucose, capillary     Status: Abnormal   Collection Time: 05/08/15  8:38 PM  Result Value Ref Range   Glucose-Capillary 172 (H) 65 - 99 mg/dL   Comment 1 Notify RN    Comment 2 Document in Chart   Glucose, capillary     Status: Abnormal   Collection Time: 05/09/15  7:28 AM  Result Value Ref Range   Glucose-Capillary 114 (H) 65 - 99 mg/dL   Comment 1 Notify RN    Comment 2 Document in Chart    Brain natriuretic peptide     Status: None   Collection Time: 05/09/15  9:08 AM  Result Value Ref Range   B Natriuretic Peptide 81.0 0.0 - 100.0 pg/mL  Glucose, capillary     Status: Abnormal   Collection Time: 05/09/15 11:42 AM  Result Value Ref Range   Glucose-Capillary 160 (H) 65 - 99 mg/dL   Comment 1 Notify RN    Comment 2 Document in Chart   Glucose, capillary     Status: Abnormal   Collection Time: 05/09/15  4:30 PM  Result Value Ref Range   Glucose-Capillary 165 (H) 65 - 99 mg/dL   Comment 1 Notify RN    Comment 2 Document in Chart   Troponin I (q 6hr x 3)     Status: Abnormal   Collection Time: 05/09/15  5:00 PM  Result Value Ref Range   Troponin I 0.04 (H) <0.031 ng/mL    Comment:        PERSISTENTLY INCREASED TROPONIN VALUES IN THE RANGE OF 0.04-0.49 ng/mL CAN BE SEEN IN:       -UNSTABLE ANGINA       -CONGESTIVE HEART FAILURE       -MYOCARDITIS       -CHEST TRAUMA       -ARRYHTHMIAS       -  LATE PRESENTING MYOCARDIAL INFARCTION       -COPD   CLINICAL FOLLOW-UP RECOMMENDED.   Glucose, capillary     Status: Abnormal   Collection Time: 05/09/15  8:43 PM  Result Value Ref Range   Glucose-Capillary 139 (H) 65 - 99 mg/dL  Troponin I (q 6hr x 3)     Status: Abnormal   Collection Time: 05/09/15 10:16 PM  Result Value Ref Range   Troponin I 0.04 (H) <0.031 ng/mL    Comment:        PERSISTENTLY INCREASED TROPONIN VALUES IN THE RANGE OF 0.04-0.49 ng/mL CAN BE SEEN IN:       -UNSTABLE ANGINA       -CONGESTIVE HEART FAILURE       -MYOCARDITIS       -CHEST TRAUMA       -ARRYHTHMIAS       -LATE PRESENTING MYOCARDIAL INFARCTION       -COPD   CLINICAL FOLLOW-UP RECOMMENDED.   Troponin I (q 6hr x 3)     Status: Abnormal   Collection Time: 05/10/15  4:26 AM  Result Value Ref Range   Troponin I 0.05 (H) <0.031 ng/mL    Comment:        PERSISTENTLY INCREASED TROPONIN VALUES IN THE RANGE OF 0.04-0.49 ng/mL CAN BE SEEN IN:       -UNSTABLE ANGINA       -CONGESTIVE  HEART FAILURE       -MYOCARDITIS       -CHEST TRAUMA       -ARRYHTHMIAS       -LATE PRESENTING MYOCARDIAL INFARCTION       -COPD   CLINICAL FOLLOW-UP RECOMMENDED.   Basic metabolic panel     Status: Abnormal   Collection Time: 05/10/15  4:26 AM  Result Value Ref Range   Sodium 135 135 - 145 mmol/Kaufman   Potassium 4.1 3.5 - 5.1 mmol/Kaufman   Chloride 105 101 - 111 mmol/Kaufman   CO2 20 (Kaufman) 22 - 32 mmol/Kaufman   Glucose, Bld 106 (H) 65 - 99 mg/dL   BUN 47 (H) 6 - 20 mg/dL   Creatinine, Ser 9.12 (H) 0.61 - 1.24 mg/dL   Calcium 7.7 (Kaufman) 8.9 - 10.3 mg/dL   GFR calc non Af Amer 49 (Kaufman) >60 mL/min   GFR calc Af Amer 56 (Kaufman) >60 mL/min    Comment: (NOTE) The eGFR has been calculated using the CKD EPI equation. This calculation has not been validated in all clinical situations. eGFR's persistently <60 mL/min signify possible Chronic Kidney Disease.    Anion gap 10 5 - 15  Glucose, capillary     Status: Abnormal   Collection Time: 05/10/15  7:18 AM  Result Value Ref Range   Glucose-Capillary 111 (H) 65 - 99 mg/dL    ABGS No results for input(s): PHART, PO2ART, TCO2, HCO3 in the last 72 hours.  Invalid input(s): PCO2 CULTURES No results found for this or any previous visit (from the past 240 hour(s)). Studies/Results: Dg Chest 2 View  05/09/2015   CLINICAL DATA:  Shortness of breath.  EXAM: CHEST  2 VIEW  COMPARISON:  May 03, 2015.  FINDINGS: Stable cardiomediastinal silhouette. Sternotomy wires are noted status post cardiac valve repair. Right axillary surgical clips are noted. No pneumothorax or pleural effusion is noted. No acute pulmonary disease is noted.  IMPRESSION: No active cardiopulmonary disease.   Electronically Signed   By: Lupita Raider, M.D.   On: 05/09/2015 10:40  Medications:  Prior to Admission:  Prescriptions prior to admission  Medication Sig Dispense Refill Last Dose  . aspirin EC 81 MG tablet Take 81 mg by mouth daily.   05/03/2015 at Unknown time  .  HYDROcodone-acetaminophen (NORCO) 7.5-325 MG per tablet Take 1 tablet by mouth every 4 (four) hours as needed for moderate pain or severe pain.   0 05/03/2015 at Unknown time  . insulin aspart (NOVOLOG) 100 UNIT/ML FlexPen Inject 20 Units into the skin 3 (three) times daily with meals. Sliding scale   05/03/2015 at Unknown time  . insulin glargine (LANTUS) 100 unit/mL SOPN Inject 20-28 Units into the skin 2 (two) times daily. 28 units in the morning and 20 units before bed   05/02/2015 at Unknown time  . lisinopril (PRINIVIL,ZESTRIL) 10 MG tablet Take 10 mg by mouth daily.   unknown at Unknown time  . simvastatin (ZOCOR) 40 MG tablet Take 40 mg by mouth daily.  3 05/02/2015 at Unknown time  . oxyCODONE-acetaminophen (PERCOCET/ROXICET) 5-325 MG per tablet Take 1 tablet by mouth every 6 (six) hours as needed. 20 tablet 0    Scheduled: . antiseptic oral rinse  7 mL Mouth Rinse BID  . aspirin EC  325 mg Oral Daily  . budesonide-formoterol  2 puff Inhalation BID  . heparin  5,000 Units Subcutaneous 3 times per day  . insulin aspart  0-15 Units Subcutaneous TID WC  . insulin aspart  0-5 Units Subcutaneous QHS  . insulin aspart  10 Units Subcutaneous TID WC  . insulin glargine  25 Units Subcutaneous BID  . levalbuterol  0.63 mg Nebulization TID  . levofloxacin  750 mg Oral Daily  . lisinopril  10 mg Oral Daily  . metoprolol tartrate  50 mg Oral BID  . predniSONE  40 mg Oral Q breakfast  . simvastatin  40 mg Oral Daily  . sodium chloride  3 mL Intravenous Q12H  . tiotropium  18 mcg Inhalation Daily   Continuous:  VXY:IAXKPV chloride, acetaminophen **OR** acetaminophen, alum & mag hydroxide-simeth, guaiFENesin-dextromethorphan, HYDROcodone-acetaminophen, levalbuterol, magnesium hydroxide, ondansetron **OR** ondansetron (ZOFRAN) IV, sodium chloride, sorbitol  Assesment: He was admitted with acute hypoxic respiratory failure. He has COPD at baseline. He now has what appears to be cardiac arrhythmias as  I assume were related to his COPD. He has been on metoprolol but this has not controlled his heart rate. Because of his COPD he may need another agent but cardiology has been consulted and I will leave that to their judgment. Principal Problem:   Acute respiratory failure with hypoxia Active Problems:   COPD (chronic obstructive pulmonary disease)   COPD with exacerbation   Sinus tachycardia   Diabetes mellitus without complication   Hypertension   Coronary artery disease   Chest pain   CKD (chronic kidney disease)   Obesity   Acute respiratory failure    Plan: Continue with inhaled bronchodilators and steroids and antibiotics. For cardiology consultation    LOS: 7 days   Vincent Kaufman 05/10/2015, 8:54 AM

## 2015-05-10 NOTE — Progress Notes (Signed)
ANTIBIOTIC CONSULT NOTE - follow up  Pharmacy Consult for Levaquin Indication: COPD  Allergies  Allergen Reactions  . Penicillins Rash   Patient Measurements: Height:  (185.4 cm) Weight: (!) 318 lb 5.5 oz (144.4 kg) IBW/kg (Calculated) : 79.9   Vital Signs: Temp: 97.7 F (36.5 C) (07/26 0537) Temp Source: Oral (07/26 0537) BP: 129/90 mmHg (07/26 0537) Pulse Rate: 124 (07/26 0537) Intake/Output from previous day: 07/25 0701 - 07/26 0700 In: 720 [P.O.:720] Out: -  Intake/Output from this shift:    Labs:  Recent Labs  05/10/15 0426  CREATININE 1.48*   Estimated Creatinine Clearance: 76.4 mL/min (by C-G formula based on Cr of 1.48). No results for input(s): VANCOTROUGH, VANCOPEAK, VANCORANDOM, GENTTROUGH, GENTPEAK, GENTRANDOM, TOBRATROUGH, TOBRAPEAK, TOBRARND, AMIKACINPEAK, AMIKACINTROU, AMIKACIN in the last 72 hours.   Microbiology: No results found for this or any previous visit (from the past 720 hour(s)).  Medical History: Past Medical History  Diagnosis Date  . Coronary artery disease     s/p cabg 2006  . Diabetes mellitus without complication   . Hypertension   . Asthma   . CKD (chronic kidney disease)    Medications:  Prescriptions prior to admission  Medication Sig Dispense Refill Last Dose  . aspirin EC 81 MG tablet Take 81 mg by mouth daily.   05/03/2015 at Unknown time  . HYDROcodone-acetaminophen (NORCO) 7.5-325 MG per tablet Take 1 tablet by mouth every 4 (four) hours as needed for moderate pain or severe pain.   0 05/03/2015 at Unknown time  . insulin aspart (NOVOLOG) 100 UNIT/ML FlexPen Inject 20 Units into the skin 3 (three) times daily with meals. Sliding scale   05/03/2015 at Unknown time  . insulin glargine (LANTUS) 100 unit/mL SOPN Inject 20-28 Units into the skin 2 (two) times daily. 28 units in the morning and 20 units before bed   05/02/2015 at Unknown time  . lisinopril (PRINIVIL,ZESTRIL) 10 MG tablet Take 10 mg by mouth daily.   unknown  at Unknown time  . simvastatin (ZOCOR) 40 MG tablet Take 40 mg by mouth daily.  3 05/02/2015 at Unknown time  . oxyCODONE-acetaminophen (PERCOCET/ROXICET) 5-325 MG per tablet Take 1 tablet by mouth every 6 (six) hours as needed. 20 tablet 0    Assessment: Acute respiratory failure with hypoxia: Secondary to COPD with exacerbation.  Day #8 Levaquin.   Pt is obese with elevated SCr which has improved.  Normalized clcr > 50.  Levaquin 7/19 >>  Goal of Therapy:  Eradicate infection  Plan:  Levaquin  PO q24hrs Duration of therapy per MD  Valrie Hart A 05/10/2015,8:11 AM

## 2015-05-10 NOTE — Progress Notes (Signed)
Subjective: The patient rested fairly well. His breathing has improved. But he still has episodes of tachycardia EKGs indicates flutter. He does have other problems COPD CABG diabetes mellitus and hypertension chronic kidney disease  Objective: Vital signs in last 24 hours: Temp:  [97.7 F (36.5 C)-98.8 F (37.1 C)] 97.7 F (36.5 C) (07/26 0537) Pulse Rate:  [123-125] 124 (07/26 0537) Resp:  [20] 20 (07/26 0537) BP: (116-133)/(90-96) 129/90 mmHg (07/26 0537) SpO2:  [94 %-99 %] 97 % (07/26 0537) Weight:  [144.4 kg (318 lb 5.5 oz)] 144.4 kg (318 lb 5.5 oz) (07/26 0537) Weight change: 1.08 kg (2 lb 6.1 oz) Last BM Date: 05/09/15  Intake/Output from previous day: 07/25 0701 - 07/26 0700 In: 720 [P.O.:720] Out: -  Intake/Output this shift:    Physical Exam: Gen. appearance-the patient is alert and oriented comfortable with his breathing at present  HEENT negative  Neck supple no JVD or thyroid abnormalities  Heart rhythm and 123-125 range  Lungs diminished breath sounds  Abdomen no palpable organs or masses  Skin warm and dry  Extremities free of edema  No results for input(s): WBC, HGB, HCT, PLT in the last 72 hours. BMET No results for input(s): NA, K, CL, CO2, GLUCOSE, BUN, CREATININE, CALCIUM in the last 72 hours.  Studies/Results: Dg Chest 2 View  05/09/2015   CLINICAL DATA:  Shortness of breath.  EXAM: CHEST  2 VIEW  COMPARISON:  May 03, 2015.  FINDINGS: Stable cardiomediastinal silhouette. Sternotomy wires are noted status post cardiac valve repair. Right axillary surgical clips are noted. No pneumothorax or pleural effusion is noted. No acute pulmonary disease is noted.  IMPRESSION: No active cardiopulmonary disease.   Electronically Signed   By: Lupita Raider, M.D.   On: 05/09/2015 10:40    Medications:  . antiseptic oral rinse  7 mL Mouth Rinse BID  . aspirin EC  325 mg Oral Daily  . budesonide-formoterol  2 puff Inhalation BID  . heparin  5,000 Units  Subcutaneous 3 times per day  . insulin aspart  0-15 Units Subcutaneous TID WC  . insulin aspart  0-5 Units Subcutaneous QHS  . insulin aspart  10 Units Subcutaneous TID WC  . insulin glargine  25 Units Subcutaneous BID  . levalbuterol  0.63 mg Nebulization TID  . levofloxacin  750 mg Oral Q48H  . lisinopril  10 mg Oral Daily  . metoprolol tartrate  50 mg Oral BID  . predniSONE  40 mg Oral Q breakfast  . simvastatin  40 mg Oral Daily  . sodium chloride  3 mL Intravenous Q12H  . tiotropium  18 mcg Inhalation Daily        Assessment/Plan: 1. COPD acute respiratory failure with some improvement plan to continue by mouth prednisone-IV Levaquin neb treatments  2. Essential hypertension control continue lisinopril metoprolol  3. Diabetes mellitus without complication-sugars are better controlled continue current dosage of Lantus insulin and short-acting insulin continue diabetic diet  4. Cardiac arrhythmia atrial flutter-plan cardiology consult continue current regimen   LOS: 7 days   Berdena Cisek G 05/10/2015, 6:04 AM

## 2015-05-11 LAB — GLUCOSE, CAPILLARY
GLUCOSE-CAPILLARY: 113 mg/dL — AB (ref 65–99)
GLUCOSE-CAPILLARY: 180 mg/dL — AB (ref 65–99)
GLUCOSE-CAPILLARY: 253 mg/dL — AB (ref 65–99)
Glucose-Capillary: 225 mg/dL — ABNORMAL HIGH (ref 65–99)

## 2015-05-11 MED ORDER — FUROSEMIDE 10 MG/ML IJ SOLN
40.0000 mg | Freq: Once | INTRAMUSCULAR | Status: AC
  Administered 2015-05-11: 40 mg via INTRAVENOUS
  Filled 2015-05-11: qty 4

## 2015-05-11 MED ORDER — DILTIAZEM HCL 60 MG PO TABS
90.0000 mg | ORAL_TABLET | Freq: Three times a day (TID) | ORAL | Status: DC
  Administered 2015-05-11 – 2015-05-14 (×9): 90 mg via ORAL
  Filled 2015-05-11 (×18): qty 1

## 2015-05-11 NOTE — Progress Notes (Signed)
Subjective: He says he feels better. He has no new complaints. His breathing is doing better.  Objective: Vital signs in last 24 hours: Temp:  [97.5 F (36.4 C)-98.1 F (36.7 C)] 97.5 F (36.4 C) (07/27 0522) Pulse Rate:  [59-122] 59 (07/27 0522) Resp:  [20] 20 (07/27 0522) BP: (107-137)/(76-99) 130/99 mmHg (07/27 0522) SpO2:  [95 %-100 %] 96 % (07/27 0741) Weight:  [143.8 kg (317 lb 0.3 oz)] 143.8 kg (317 lb 0.3 oz) (07/27 0522) Weight change: -0.6 kg (-1 lb 5.2 oz) Last BM Date: 05/10/15  Intake/Output from previous day: 07/26 0701 - 07/27 0700 In: 48 [P.O.:720] Out: -   PHYSICAL EXAM General appearance: alert, cooperative, no distress and morbidly obese Resp: clear to auscultation bilaterally Cardio: regular rate and rhythm, S1, S2 normal, no murmur, click, rub or gallop GI: soft, non-tender; bowel sounds normal; no masses,  no organomegaly Extremities: extremities normal, atraumatic, no cyanosis or edema  Lab Results:  Results for orders placed or performed during the hospital encounter of 05/03/15 (from the past 48 hour(s))  Glucose, capillary     Status: Abnormal   Collection Time: 05/09/15 11:42 AM  Result Value Ref Range   Glucose-Capillary 160 (H) 65 - 99 mg/dL   Comment 1 Notify RN    Comment 2 Document in Chart   Glucose, capillary     Status: Abnormal   Collection Time: 05/09/15  4:30 PM  Result Value Ref Range   Glucose-Capillary 165 (H) 65 - 99 mg/dL   Comment 1 Notify RN    Comment 2 Document in Chart   Troponin I (q 6hr x 3)     Status: Abnormal   Collection Time: 05/09/15  5:00 PM  Result Value Ref Range   Troponin I 0.04 (H) <0.031 ng/mL    Comment:        PERSISTENTLY INCREASED TROPONIN VALUES IN THE RANGE OF 0.04-0.49 ng/mL CAN BE SEEN IN:       -UNSTABLE ANGINA       -CONGESTIVE HEART FAILURE       -MYOCARDITIS       -CHEST TRAUMA       -ARRYHTHMIAS       -LATE PRESENTING MYOCARDIAL INFARCTION       -COPD   CLINICAL FOLLOW-UP  RECOMMENDED.   Glucose, capillary     Status: Abnormal   Collection Time: 05/09/15  8:43 PM  Result Value Ref Range   Glucose-Capillary 139 (H) 65 - 99 mg/dL  Troponin I (q 6hr x 3)     Status: Abnormal   Collection Time: 05/09/15 10:16 PM  Result Value Ref Range   Troponin I 0.04 (H) <0.031 ng/mL    Comment:        PERSISTENTLY INCREASED TROPONIN VALUES IN THE RANGE OF 0.04-0.49 ng/mL CAN BE SEEN IN:       -UNSTABLE ANGINA       -CONGESTIVE HEART FAILURE       -MYOCARDITIS       -CHEST TRAUMA       -ARRYHTHMIAS       -LATE PRESENTING MYOCARDIAL INFARCTION       -COPD   CLINICAL FOLLOW-UP RECOMMENDED.   Troponin I (q 6hr x 3)     Status: Abnormal   Collection Time: 05/10/15  4:26 AM  Result Value Ref Range   Troponin I 0.05 (H) <0.031 ng/mL    Comment:        PERSISTENTLY INCREASED TROPONIN VALUES IN THE RANGE OF 0.04-0.49  ng/mL CAN BE SEEN IN:       -UNSTABLE ANGINA       -CONGESTIVE HEART FAILURE       -MYOCARDITIS       -CHEST TRAUMA       -ARRYHTHMIAS       -LATE PRESENTING MYOCARDIAL INFARCTION       -COPD   CLINICAL FOLLOW-UP RECOMMENDED.   Basic metabolic panel     Status: Abnormal   Collection Time: 05/10/15  4:26 AM  Result Value Ref Range   Sodium 135 135 - 145 mmol/L   Potassium 4.1 3.5 - 5.1 mmol/L   Chloride 105 101 - 111 mmol/L   CO2 20 (L) 22 - 32 mmol/L   Glucose, Bld 106 (H) 65 - 99 mg/dL   BUN 47 (H) 6 - 20 mg/dL   Creatinine, Ser 1.48 (H) 0.61 - 1.24 mg/dL   Calcium 7.7 (L) 8.9 - 10.3 mg/dL   GFR calc non Af Amer 49 (L) >60 mL/min   GFR calc Af Amer 56 (L) >60 mL/min    Comment: (NOTE) The eGFR has been calculated using the CKD EPI equation. This calculation has not been validated in all clinical situations. eGFR's persistently <60 mL/min signify possible Chronic Kidney Disease.    Anion gap 10 5 - 15  Magnesium     Status: Abnormal   Collection Time: 05/10/15  4:26 AM  Result Value Ref Range   Magnesium 2.5 (H) 1.7 - 2.4 mg/dL   Glucose, capillary     Status: Abnormal   Collection Time: 05/10/15  7:18 AM  Result Value Ref Range   Glucose-Capillary 111 (H) 65 - 99 mg/dL  Glucose, capillary     Status: Abnormal   Collection Time: 05/10/15 11:46 AM  Result Value Ref Range   Glucose-Capillary 130 (H) 65 - 99 mg/dL   Comment 1 Notify RN    Comment 2 Document in Chart   Glucose, capillary     Status: Abnormal   Collection Time: 05/10/15  4:30 PM  Result Value Ref Range   Glucose-Capillary 195 (H) 65 - 99 mg/dL   Comment 1 Notify RN    Comment 2 Document in Chart   Glucose, capillary     Status: Abnormal   Collection Time: 05/10/15 10:07 PM  Result Value Ref Range   Glucose-Capillary 216 (H) 65 - 99 mg/dL   Comment 1 Notify RN    Comment 2 Document in Chart   Glucose, capillary     Status: Abnormal   Collection Time: 05/11/15  7:24 AM  Result Value Ref Range   Glucose-Capillary 113 (H) 65 - 99 mg/dL   Comment 1 Notify RN     ABGS No results for input(s): PHART, PO2ART, TCO2, HCO3 in the last 72 hours.  Invalid input(s): PCO2 CULTURES No results found for this or any previous visit (from the past 240 hour(s)). Studies/Results: Dg Chest 2 View  05/09/2015   CLINICAL DATA:  Shortness of breath.  EXAM: CHEST  2 VIEW  COMPARISON:  May 03, 2015.  FINDINGS: Stable cardiomediastinal silhouette. Sternotomy wires are noted status post cardiac valve repair. Right axillary surgical clips are noted. No pneumothorax or pleural effusion is noted. No acute pulmonary disease is noted.  IMPRESSION: No active cardiopulmonary disease.   Electronically Signed   By: Marijo Conception, M.D.   On: 05/09/2015 10:40    Medications:  Prior to Admission:  Prescriptions prior to admission  Medication Sig Dispense Refill Last Dose  .  aspirin EC 81 MG tablet Take 81 mg by mouth daily.   05/03/2015 at Unknown time  . HYDROcodone-acetaminophen (NORCO) 7.5-325 MG per tablet Take 1 tablet by mouth every 4 (four) hours as needed for  moderate pain or severe pain.   0 05/03/2015 at Unknown time  . insulin aspart (NOVOLOG) 100 UNIT/ML FlexPen Inject 20 Units into the skin 3 (three) times daily with meals. Sliding scale   05/03/2015 at Unknown time  . insulin glargine (LANTUS) 100 unit/mL SOPN Inject 20-28 Units into the skin 2 (two) times daily. 28 units in the morning and 20 units before bed   05/02/2015 at Unknown time  . lisinopril (PRINIVIL,ZESTRIL) 10 MG tablet Take 10 mg by mouth daily.   unknown at Unknown time  . simvastatin (ZOCOR) 40 MG tablet Take 40 mg by mouth daily.  3 05/02/2015 at Unknown time  . oxyCODONE-acetaminophen (PERCOCET/ROXICET) 5-325 MG per tablet Take 1 tablet by mouth every 6 (six) hours as needed. 20 tablet 0    Scheduled: . aspirin EC  81 mg Oral Daily  . atorvastatin  20 mg Oral q1800  . budesonide-formoterol  2 puff Inhalation BID  . diltiazem  60 mg Oral 3 times per day  . heparin  5,000 Units Subcutaneous 3 times per day  . insulin aspart  0-15 Units Subcutaneous TID WC  . insulin aspart  0-5 Units Subcutaneous QHS  . insulin aspart  10 Units Subcutaneous TID WC  . insulin glargine  25 Units Subcutaneous BID  . levalbuterol  0.63 mg Nebulization TID  . levofloxacin  750 mg Oral Daily  . lisinopril  10 mg Oral Daily  . predniSONE  40 mg Oral Q breakfast  . sodium chloride  3 mL Intravenous Q12H  . tiotropium  18 mcg Inhalation Daily   Continuous:  NGE:XBMWUX chloride, acetaminophen **OR** acetaminophen, alum & mag hydroxide-simeth, guaiFENesin-dextromethorphan, HYDROcodone-acetaminophen, levalbuterol, magnesium hydroxide, ondansetron **OR** ondansetron (ZOFRAN) IV, sodium chloride, sorbitol  Assesment: He was admitted with acute hypoxic respiratory failure and COPD exacerbation. He's had atrial flutter. He is overall much improved and approaching discharge Principal Problem:   Acute respiratory failure with hypoxia Active Problems:   COPD (chronic obstructive pulmonary disease)   COPD  with exacerbation   Sinus tachycardia   Diabetes mellitus without complication   Hypertension   Coronary artery disease   Chest pain   CKD (chronic kidney disease)   Obesity   Acute respiratory failure   Atrial flutter    Plan: If he is discharged today I would like him to make an appointment for a pulmonary function test as an outpatient and then I could see him in my office after that is done    LOS: 8 days   Tanda Morrissey L 05/11/2015, 9:10 AM

## 2015-05-11 NOTE — Care Management Note (Signed)
Case Management Note  Patient Details  Name: BROOX LONIGRO MRN: [id number] Date of Birth: October 20, 1950  Subjective/Objective:                    Action/Plan:   Expected Discharge Date:                  Expected Discharge Plan:  Home/Self Care  In-House Referral:  NA  Discharge planning Services  CM Consult  Post Acute Care Choice:  NA Choice offered to:  NA  DME Arranged:  Nebulizer machine DME Agency:  [facility]  HH Arranged:    [facility] Agency:     Status of Service:  Completed, signed off  Medicare Important Message Given:    Date Medicare IM Given:    Medicare IM give by:    Date Additional Medicare IM Given:    Additional Medicare Important Message give by:     If discussed at Long Length of Stay Meetings, dates discussed:  05/10/15  Additional Comments:  Cheryl Flash, RN 05/11/2015, 8:53 AM

## 2015-05-11 NOTE — Progress Notes (Signed)
Subjective: The patient had a fairly good night. He still has some dyspnea. He still has episodes of tachycardia. He was changed to diltiazem by cardiology. He does have other problems COPD previous CABG diabetes mellitus hypertension and chronic kidney disease  Objective: Vital signs in last 24 hours: Temp:  [97.5 F (36.4 C)-98.1 F (36.7 C)] 97.5 F (36.4 C) (07/27 0522) Pulse Rate:  [59-122] 59 (07/27 0522) Resp:  [20] 20 (07/27 0522) BP: (107-137)/(76-99) 130/99 mmHg (07/27 0522) SpO2:  [95 %-100 %] 100 % (07/27 0522) Weight:  [143.8 kg (317 lb 0.3 oz)] 143.8 kg (317 lb 0.3 oz) (07/27 0522) Weight change: -0.6 kg (-1 lb 5.2 oz) Last BM Date: 05/10/15  Intake/Output from previous day: 07/26 0701 - 07/27 0700 In: 720 [P.O.:720] Out: -  Intake/Output this shift:    Physical Exam: Gen. appearance patient is alert and oriented comfortable  HEENT negative  Neck supple no JVD or thyroid abnormalities  Heart tachycardia no murmurs no cardiomegaly  Lungs diminished breath sounds bilaterally  Abdomen no palpable organs or masses  Skin warm and dry  Extremities free of edema  No results for input(s): WBC, HGB, HCT, PLT in the last 72 hours. BMET  Recent Labs  05/10/15 0426  NA 135  K 4.1  CL 105  CO2 20*  GLUCOSE 106*  BUN 47*  CREATININE 1.48*  CALCIUM 7.7*    Studies/Results: Dg Chest 2 View  05/09/2015   CLINICAL DATA:  Shortness of breath.  EXAM: CHEST  2 VIEW  COMPARISON:  May 03, 2015.  FINDINGS: Stable cardiomediastinal silhouette. Sternotomy wires are noted status post cardiac valve repair. Right axillary surgical clips are noted. No pneumothorax or pleural effusion is noted. No acute pulmonary disease is noted.  IMPRESSION: No active cardiopulmonary disease.   Electronically Signed   By: Lupita Raider, M.D.   On: 05/09/2015 10:40    Medications:  . aspirin EC  81 mg Oral Daily  . atorvastatin  20 mg Oral q1800  . budesonide-formoterol  2 puff  Inhalation BID  . diltiazem  60 mg Oral 3 times per day  . heparin  5,000 Units Subcutaneous 3 times per day  . insulin aspart  0-15 Units Subcutaneous TID WC  . insulin aspart  0-5 Units Subcutaneous QHS  . insulin aspart  10 Units Subcutaneous TID WC  . insulin glargine  25 Units Subcutaneous BID  . levalbuterol  0.63 mg Nebulization TID  . levofloxacin  750 mg Oral Daily  . lisinopril  10 mg Oral Daily  . predniSONE  40 mg Oral Q breakfast  . sodium chloride  3 mL Intravenous Q12H  . tiotropium  18 mcg Inhalation Daily        Assessment/Plan: 1. COPD acute respiratory failure with some improvement-plan to continue prednisone IV Levaquin and neb treatments  2. Essential hypertension fairly well controlled  Diabetes mellitus without complications-sugars are in better control with current doses of insulin Lantus and short-acting insulin  Cardiac arrhythmia atrial flutter plan to continue diltiazem 60 mg 3 times a day continue to monitor-cardiology will see patient again today   LOS: 8 days   Tyjai Charbonnet G 05/11/2015, 6:21 AM

## 2015-05-11 NOTE — Progress Notes (Signed)
Subjective:  Breathing ok.  Objective:  Vital Signs in the last 24 hours: Temp:  [97.5 F (36.4 C)-98.1 F (36.7 C)] 97.5 F (36.4 C) (07/27 0522) Pulse Rate:  [59-122] 59 (07/27 0522) Resp:  [20] 20 (07/27 0522) BP: (107-137)/(76-99) 130/99 mmHg (07/27 0522) SpO2:  [95 %-100 %] 96 % (07/27 0741) Weight:  [317 lb 0.3 oz (143.8 kg)] 317 lb 0.3 oz (143.8 kg) (07/27 0522)  Intake/Output from previous day: 07/26 0701 - 07/27 0700 In: 720 [P.O.:720] Out: -  Intake/Output from this shift: Total I/O In: 360 [P.O.:360] Out: -   Physical Exam: NECK: Without JVD, HJR, or bruit LUNGS: Decreased Breath sounds throughout HEART: Irregular rate and rhythm, no murmur, gallop, rub, bruit, thrill, or heave EXTREMITIES: plus 2 edema up to knees.Without cyanosis, clubbing  Lab Results: No results for input(s): WBC, HGB, PLT in the last 72 hours.  Recent Labs  05/10/15 0426  NA 135  K 4.1  CL 105  CO2 20*  GLUCOSE 106*  BUN 47*  CREATININE 1.48*    Recent Labs  05/09/15 2216 05/10/15 0426  TROPONINI 0.04* 0.05*   Hepatic Function Panel No results for input(s): PROT, ALBUMIN, AST, ALT, ALKPHOS, BILITOT, BILIDIR, IBILI in the last 72 hours. No results for input(s): CHOL in the last 72 hours. No results for input(s): PROTIME in the last 72 hours.  Imaging:   Cardiac Studies: 1. Echocardiogram 7/20/20016 Left ventricle: The cavity size was normal. Wall thickness was   increased in a pattern of moderate LVH. Systolic function was   normal. The estimated ejection fraction was in the range of 60%   to 65%. Although no diagnostic regional wall motion abnormality   was identified, this possibility cannot be completely excluded on   the basis of this study. The study is not technically sufficient   to allow evaluation of LV diastolic function. - Aortic valve: Moderately calcified annulus. Mildly calcified   leaflets. Difficult to say that this is not a bioprosthetic   aortic  valve, although not stated in his history specifically.   Mean gradient (S): 18 mm Hg. Peak gradient (S): 31 mm Hg. Peak   velocity ratio of LVOT to aortic valve: 0.41. Valve area (Vmax):   1.28 cm^2. - Aortic root: The aortic root was mildly to moderately dilated. - Mitral valve: Calcified annulus. - Left atrium: The atrium was mildly dilated. - Right atrium: Central venous pressure (est): 3 mm Hg. - Tricuspid valve: There was trivial regurgitation. - Pulmonary arteries: Systolic pressure could not be accurately   estimated. - Pericardium, extracardiac: There was no pericardial effusion. ------------------------------------------------------------------------------------------( Below copied from [facility]) Cardiac CT [facility] 09/20/2006 Impression: 1. Status post with replacement of the aortic valve and ascending aorta. Fluid collection surrounding the ascending aortic graft is again identified, stable or perhaps slightly smaller in the interval. There is no evidence of extravasation of the contrast media or active bleeding. 2. Stable pulmonary nodules.  AAA repair with Aortic Valve Replacement 3/152006 [facility] Carpenter Edwards Valve Replacement Valve size 25.0 Normal diameter sinotubular junction (27 mm) and aorta replaced with 28 mm Dacron graft. Aorta approximately 30 mm in diameter at level of distal anastomosis.   Cardiac Cath 12/25/2004 [facility] DIAGNOSTIC SUMMARY Coronary Artery Disease Left Main: normal LAD system: normal LCX system: normal RCA system: normal No significant CAD indicated Right Heart Catheterization Pulmonary Hypertension: Absent Valvular Disease Aortic Regurgitation: Severe Thoracic Aortogram Aortic Regurgitation Grade: 4+   Assessment/Plan:  New atrial flutter rate still  120 on dilt 60 mg q 8. Will increase to 90 mg q 8. Chadvasc score is 2 on Eliquis.  Edema: suspect diastolic CHF ? Weights-270 on admission, next day 319, now 317. A lot of lower  extremity edema. Consider diuretics but watch kidney's closely.  AVR Capenter Edwards and AAA  COPD exacerbation  CKD Crt 1.48   LOS: 8 days    Vincent Kaufman 05/11/2015, 10:17 AM   Patient seen and discussed with PA Geni Bers, I agree with her documentation. Rates remain elevated in atrial flutter, will increase diltiazem to 90 q 8 hrs. Continue eliquis for stroke prevention. Some edema noted, likely due to steroid use as well as tachycardia. Cr 4 months ago 1.7, currently 1.5. Will dose lasix IV  x 1 today.    Dominga Ferry MD

## 2015-05-12 DIAGNOSIS — J9601 Acute respiratory failure with hypoxia: Secondary | ICD-10-CM

## 2015-05-12 DIAGNOSIS — I483 Typical atrial flutter: Secondary | ICD-10-CM

## 2015-05-12 LAB — GLUCOSE, CAPILLARY
GLUCOSE-CAPILLARY: 242 mg/dL — AB (ref 65–99)
Glucose-Capillary: 96 mg/dL (ref 65–99)
Glucose-Capillary: 198 mg/dL — ABNORMAL HIGH (ref 65–99)
Glucose-Capillary: 200 mg/dL — ABNORMAL HIGH (ref 65–99)

## 2015-05-12 MED ORDER — APIXABAN 5 MG PO TABS
5.0000 mg | ORAL_TABLET | Freq: Two times a day (BID) | ORAL | Status: DC
  Administered 2015-05-12 – 2015-05-14 (×5): 5 mg via ORAL
  Filled 2015-05-12 (×5): qty 1

## 2015-05-12 NOTE — Care Management Note (Signed)
Case Management Note  Patient Details  Name: ARTH NICASTRO MRN: [id number] Date of Birth: 07/28/1951  Subjective/Objective:                    Action/Plan:   Expected Discharge Date:                  Expected Discharge Plan:  Home/Self Care  In-House Referral:  NA  Discharge planning Services  CM Consult  Post Acute Care Choice:  NA Choice offered to:  NA  DME Arranged:  Nebulizer machine DME Agency:  [facility]  HH Arranged:    [facility] Agency:     Status of Service:  Completed, signed off  Medicare Important Message Given:    Date Medicare IM Given:    Medicare IM give by:    Date Additional Medicare IM Given:    Additional Medicare Important Message give by:     If discussed at Long Length of Stay Meetings, dates discussed:05/12/15    Additional Comments:  Cheryl Flash, RN 05/12/2015, 3:11 PM

## 2015-05-12 NOTE — Progress Notes (Signed)
He says he feels a little better. He still has some shortness of breath when he is walking but overall is better.  His exam shows he looks much more comfortable. His chest is pretty clear now. He is mildly tachycardic but his heart is regular. He is morbidly obese.  He seems to be improving. He still had some trouble with tachycardia which is being addressed by cardiology team.  I will plan to sign off as far as his hospital care at this point. As mentioned yesterday he will need to go home on the current bronchodilators and he will need pulmonary function testing as an outpatient and then follow-up in my office. I discussed that with the patient and he understands

## 2015-05-12 NOTE — Progress Notes (Signed)
Consulting cardiologist: Charlton Haws MD Primary Cardiologist: Dina Rich MD  Cardiology Specific Problem List:  1. Atrial Flutter-CHADS VASC Score 2.  2. AoV Replacement-Carpenter Edwards 3. Hypertension  Subjective:    Feels better, still breathing hard with exertion by walking in the halls.   Objective:   Temp:  [97.9 F (36.6 C)-98.2 F (36.8 C)] 98.1 F (36.7 C) (07/28 0439) Pulse Rate:  [68-122] 120 (07/28 0439) Resp:  [18-20] 18 (07/28 0439) BP: (113-138)/(73-92) 138/92 mmHg (07/28 0439) SpO2:  [94 %-99 %] 94 % (07/28 0723) Weight:  [312 lb 6.3 oz (141.7 kg)] 312 lb 6.3 oz (141.7 kg) (07/28 0439) Last BM Date: 05/10/15  Filed Weights   05/10/15 0537 05/11/15 0522 05/12/15 0439  Weight: 318 lb 5.5 oz (144.4 kg) 317 lb 0.3 oz (143.8 kg) 312 lb 6.3 oz (141.7 kg)    Intake/Output Summary (Last 24 hours) at 05/12/15 0904 Last data filed at 05/11/15 2200  Gross per 24 hour  Intake    480 ml  Output      0 ml  Net    480 ml    Telemetry: Atrial flutter rates in the 120's.   Exam:  General: No acute distress.  HEENT: Conjunctiva and lids normal, oropharynx clear.  Lungs: Bilateral wheezes in the bases, some scant wheezing inspiratory in the upper airway.   Cardiac: No elevated JVP or bruits. RRR tachycardic, no gallop or rub.   Abdomen: Normoactive bowel sounds, nontender, nondistended.Obese.   Extremities: No pitting edema, distal pulses full.  Neuropsychiatric: Alert and oriented x3, affect appropriate.  Lab Results:  Basic Metabolic Panel:  Recent Labs Lab 05/07/15 0505 05/10/15 0426  NA 136 135  K 4.4 4.1  CL 106 105  CO2 18* 20*  GLUCOSE 284* 106*  BUN 54* 47*  CREATININE 1.59* 1.48*  CALCIUM 7.9* 7.7*  MG  --  2.5*    Cardiac Enzymes:  Recent Labs Lab 05/09/15 1700 05/09/15 2216 05/10/15 0426  TROPONINI 0.04* 0.04* 0.05*  :   Medications:   Scheduled Medications: . aspirin EC  81 mg Oral Daily  . atorvastatin   20 mg Oral q1800  . budesonide-formoterol  2 puff Inhalation BID  . diltiazem  90 mg Oral 3 times per day  . heparin  5,000 Units Subcutaneous 3 times per day  . insulin aspart  0-15 Units Subcutaneous TID WC  . insulin aspart  0-5 Units Subcutaneous QHS  . insulin aspart  10 Units Subcutaneous TID WC  . insulin glargine  25 Units Subcutaneous BID  . levalbuterol  0.63 mg Nebulization TID  . levofloxacin  750 mg Oral Daily  . lisinopril  10 mg Oral Daily  . predniSONE  40 mg Oral Q breakfast  . sodium chloride  3 mL Intravenous Q12H  . tiotropium  18 mcg Inhalation Daily    PRN Medications: sodium chloride, acetaminophen **OR** acetaminophen, alum & mag hydroxide-simeth, guaiFENesin-dextromethorphan, HYDROcodone-acetaminophen, levalbuterol, magnesium hydroxide, ondansetron **OR** ondansetron (ZOFRAN) IV, sodium chloride, sorbitol   Assessment and Plan:   1.Atrial flutter: Rate is not well controlled currently despite increase in diltiazem dose to 90 mg Q 8 hours. Creatinine is 1.48. BP is 138/92. Will discuss with Dr. Eden Emms about increasing diltiazem as BP can tolerate this vs second AV nodal blocker. He is still having some wheezing but may consider low dose BB vs adding digoxin? Start Elquis 5 mg BID.   2. AVR-Carpenter Edwards:   3. COPD: Ongoing treatment per Dr. Juanetta Gosling.  Question HR elevation related to steroids.   Bettey Mare. Lawrence NP [facility]  05/12/2015, 9:04 AM   Agree with above.  Will start lopressor 12.5 bid for better rate control as telemetry shows resitng HR;s 120 range Exam remarkable for obesity no active wheezing and SEM through tissue valve.    Charlton Haws

## 2015-05-12 NOTE — Progress Notes (Signed)
Subjective: The patient is fairly comfortable this morning but still has some dyspnea and fast heart rate. His diltiazem dosage was increased by cardiology  Objective: Vital signs in last 24 hours: Temp:  [97.9 F (36.6 C)-98.2 F (36.8 C)] 98.1 F (36.7 C) (07/28 0439) Pulse Rate:  [68-122] 120 (07/28 0439) Resp:  [18-20] 18 (07/28 0439) BP: (113-138)/(73-92) 138/92 mmHg (07/28 0439) SpO2:  [94 %-99 %] 97 % (07/28 0439) Weight:  [141.7 kg (312 lb 6.3 oz)] 141.7 kg (312 lb 6.3 oz) (07/28 0439) Weight change: -2.1 kg (-4 lb 10.1 oz) Last BM Date: 05/10/15  Intake/Output from previous day: 07/27 0701 - 07/28 0700 In: 840 [P.O.:840] Out: -  Intake/Output this shift: Total I/O In: 240 [P.O.:240] Out: -   Physical Exam: Gen. appearance the patient is alert and oriented and comfortable  HEENT negative  Neck supple no JVD or thyroid abnormalities  Heart tachycardia no murmurs no cardiomegaly  Lungs diminished breath sounds bilaterally  Abdomen no palpable organs or masses  Skin warm and dry  No results for input(s): WBC, HGB, HCT, PLT in the last 72 hours. BMET  Recent Labs  05/10/15 0426  NA 135  K 4.1  CL 105  CO2 20*  GLUCOSE 106*  BUN 47*  CREATININE 1.48*  CALCIUM 7.7*    Studies/Results: No results found.  Medications:  . aspirin EC  81 mg Oral Daily  . atorvastatin  20 mg Oral q1800  . budesonide-formoterol  2 puff Inhalation BID  . diltiazem  90 mg Oral 3 times per day  . heparin  5,000 Units Subcutaneous 3 times per day  . insulin aspart  0-15 Units Subcutaneous TID WC  . insulin aspart  0-5 Units Subcutaneous QHS  . insulin aspart  10 Units Subcutaneous TID WC  . insulin glargine  25 Units Subcutaneous BID  . levalbuterol  0.63 mg Nebulization TID  . levofloxacin  750 mg Oral Daily  . lisinopril  10 mg Oral Daily  . predniSONE  40 mg Oral Q breakfast  . sodium chloride  3 mL Intravenous Q12H  . tiotropium  18 mcg Inhalation Daily         Assessment/Plan: 1. COPD acute on chronic respiratory failure somewhat improved-plan to continue prednisone IV Levaquin and LOS: 9 days  nebulizer treatments  2. Cardiac arrhythmia with atrial flutter-patient currently on diltiazem 98 day mg 3 times daily-patient will be seen again by cardiology today  3. Diabetes mellitus without complication plan to continue Lantus insulin and short-acting insulin   Virl Coble G 05/12/2015, 6:37 AM

## 2015-05-12 NOTE — Progress Notes (Signed)
Inpatient Diabetes Program Recommendations  AACE/ADA: New Consensus Statement on Inpatient Glycemic Control (2013)  Target Ranges:  Prepandial:   less than 140 mg/dL      Peak postprandial:   less than 180 mg/dL (1-2 hours)      Critically ill patients:  140 - 180 mg/dL   Results for Vincent Kaufman, Vincent Kaufman (MRN [id number]) as of 05/12/2015 08:52  Ref. Range 05/11/2015 07:24 05/11/2015 11:19 05/11/2015 16:17 05/11/2015 20:48 05/12/2015 07:36  Glucose-Capillary Latest Ref Range: 65-99 mg/dL 409 (H) 811 (H) 914 (H) 253 (H) 96   SOB/?COPD  Diabetes history: DM 2 Outpatient Diabetes medications: Novolog 20 units TID, Lantus 28 units QAM, 20 units QPM Current orders for Inpatient glycemic control: Lantus 25 units BID, Novolog moderate + HS scale + 10 units meal coverage  Inpatient Diabetes Program Recommendations  Correction (SSI): Glucose still spiking with meals. Please consider either increasing correction to Novolog Resistant (0-20 units) TID, or increase meal coverage to 12 units TID.   Thanks,  Christena Deem RN, MSN, [facility] Inpatient Diabetes Coordinator Team Pager [phone]

## 2015-05-12 NOTE — Discharge Instructions (Signed)
Apixaban oral tablets What is this medicine? APIXABAN (a PIX a ban) is an anticoagulant (blood thinner). It is used to lower the chance of stroke in people with a medical condition called atrial fibrillation. It is also used to treat or prevent blood clots in the lungs or in the veins. This medicine may be used for other purposes; ask your health care provider or pharmacist if you have questions. COMMON BRAND NAME(S): Eliquis What should I tell my health care provider before I take this medicine? They need to know if you have any of these conditions: -bleeding disorders -bleeding in the brain -blood in your stools (black or tarry stools) or if you have blood in your vomit -history of stomach bleeding -kidney disease -liver disease -mechanical heart valve -an unusual or allergic reaction to apixaban, other medicines, foods, dyes, or preservatives -pregnant or trying to get pregnant -breast-feeding How should I use this medicine? Take this medicine by mouth with a glass of water. Follow the directions on the prescription label. You can take it with or without food. If it upsets your stomach, take it with food. Take your medicine at regular intervals. Do not take it more often than directed. Do not stop taking except on your doctor's advice. Stopping this medicine may increase your risk of a blot clot. Be sure to refill your prescription before you run out of medicine. Talk to your pediatrician regarding the use of this medicine in children. Special care may be needed. Overdosage: If you think you have taken too much of this medicine contact a poison control center or emergency room at once. NOTE: This medicine is only for you. Do not share this medicine with others. What if I miss a dose? If you miss a dose, take it as soon as you can. If it is almost time for your next dose, take only that dose. Do not take double or extra doses. What may interact with this medicine? This medicine may  interact with the following: -aspirin and aspirin-like medicines -certain medicines for fungal infections like ketoconazole and itraconazole -certain medicines for seizures like carbamazepine and phenytoin -certain medicines that treat or prevent blood clots like warfarin, enoxaparin, and dalteparin -clarithromycin -NSAIDs, medicines for pain and inflammation, like ibuprofen or naproxen -rifampin -ritonavir -St. John's wort This list may not describe all possible interactions. Give your health care provider a list of all the medicines, herbs, non-prescription drugs, or dietary supplements you use. Also tell them if you smoke, drink alcohol, or use illegal drugs. Some items may interact with your medicine. What should I watch for while using this medicine? Notify your doctor or health care professional and seek emergency treatment if you develop breathing problems; changes in vision; chest pain; severe, sudden headache; pain, swelling, warmth in the leg; trouble speaking; sudden numbness or weakness of the face, arm, or leg. These can be signs that your condition has gotten worse. If you are going to have surgery, tell your doctor or health care professional that you are taking this medicine. Tell your health care professional that you use this medicine before you have a spinal or epidural procedure. Sometimes people who take this medicine have bleeding problems around the spine when they have a spinal or epidural procedure. This bleeding is very rare. If you have a spinal or epidural procedure while on this medicine, call your health care professional immediately if you have back pain, numbness or tingling (especially in your legs and feet), muscle weakness, paralysis, or loss   of bladder or bowel control. Avoid sports and activities that might cause injury while you are using this medicine. Severe falls or injuries can cause unseen bleeding. Be careful when using sharp tools or knives. Consider using  an electric razor. Take special care brushing or flossing your teeth. Report any injuries, bruising, or red spots on the skin to your doctor or health care professional. What side effects may I notice from receiving this medicine? Side effects that you should report to your doctor or health care professional as soon as possible: -allergic reactions like skin rash, itching or hives, swelling of the face, lips, or tongue -signs and symptoms of bleeding such as bloody or black, tarry stools; red or dark-brown urine; spitting up blood or brown material that looks like coffee grounds; red spots on the skin; unusual bruising or bleeding from the eye, gums, or nose This list may not describe all possible side effects. Call your doctor for medical advice about side effects. You may report side effects to FDA at 1-800-FDA-1088. Where should I keep my medicine? Keep out of the reach of children. Store at room temperature between 20 and 25 degrees C (68 and 77 degrees F). Throw away any unused medicine after the expiration date. NOTE: This sheet is a summary. It may not cover all possible information. If you have questions about this medicine, talk to your doctor, pharmacist, or health care provider.  2015, Elsevier/Gold Standard. (2013-06-05 11:59:24)  

## 2015-05-13 LAB — GLUCOSE, CAPILLARY
GLUCOSE-CAPILLARY: 131 mg/dL — AB (ref 65–99)
GLUCOSE-CAPILLARY: 176 mg/dL — AB (ref 65–99)
GLUCOSE-CAPILLARY: 153 mg/dL — AB (ref 65–99)
Glucose-Capillary: 220 mg/dL — ABNORMAL HIGH (ref 65–99)

## 2015-05-13 MED ORDER — METOPROLOL TARTRATE 25 MG PO TABS
12.5000 mg | ORAL_TABLET | Freq: Two times a day (BID) | ORAL | Status: DC
  Administered 2015-05-13 – 2015-05-14 (×3): 12.5 mg via ORAL
  Filled 2015-05-13 (×3): qty 1

## 2015-05-13 MED ORDER — INSULIN ASPART 100 UNIT/ML ~~LOC~~ SOLN
13.0000 [IU] | Freq: Three times a day (TID) | SUBCUTANEOUS | Status: DC
Start: 2015-05-13 — End: 2015-05-14
  Administered 2015-05-13 – 2015-05-14 (×3): 13 [IU] via SUBCUTANEOUS

## 2015-05-13 NOTE — Progress Notes (Signed)
Subjective: The patient had a fairly comfortable night but still has some dyspnea and fast heart rate.  Objective: Vital signs in last 24 hours: Temp:  [97.6 F (36.4 C)-98.3 F (36.8 C)] 98.3 F (36.8 C) (07/29 0447) Pulse Rate:  [118-119] 118 (07/29 0447) Resp:  [18] 18 (07/29 0447) BP: (112-125)/(72-92) 112/72 mmHg (07/29 0447) SpO2:  [94 %-99 %] 97 % (07/29 0447) Weight:  [140.5 kg (309 lb 11.9 oz)] 140.5 kg (309 lb 11.9 oz) (07/29 0447) Weight change: -1.2 kg (-2 lb 10.3 oz) Last BM Date: 05/10/15  Intake/Output from previous day: 07/28 0701 - 07/29 0700 In: 720 [P.O.:720] Out: -  Intake/Output this shift: Total I/O In: 240 [P.O.:240] Out: -   Physical Exam: Gen. appearance the patient is alert and oriented  HEENT negative  Neck supple no JVD or thyroid abnormalities  Heart tachycardia no murmurs no cardiomegaly  Lungs diminished breath sounds bilaterally  Abdomen no palpable organs or masses  Skin warm and dry  No results for input(s): WBC, HGB, HCT, PLT in the last 72 hours. BMET No results for input(s): NA, K, CL, CO2, GLUCOSE, BUN, CREATININE, CALCIUM in the last 72 hours.  Studies/Results: No results found.  Medications:  . apixaban  5 mg Oral BID  . aspirin EC  81 mg Oral Daily  . atorvastatin  20 mg Oral q1800  . budesonide-formoterol  2 puff Inhalation BID  . diltiazem  90 mg Oral 3 times per day  . insulin aspart  0-15 Units Subcutaneous TID WC  . insulin aspart  0-5 Units Subcutaneous QHS  . insulin aspart  10 Units Subcutaneous TID WC  . insulin glargine  25 Units Subcutaneous BID  . levalbuterol  0.63 mg Nebulization TID  . levofloxacin  750 mg Oral Daily  . lisinopril  10 mg Oral Daily  . predniSONE  40 mg Oral Q breakfast  . sodium chloride  3 mL Intravenous Q12H  . tiotropium  18 mcg Inhalation Daily        Assessment/Plan: 1. COPD acute on chronic respiratory failure improved-plan to continue prednisone IV Levaquin and neb  treatments  2. Cardiac arrhythmia-atrial flutter plan to continue diltiazem 90 mg 3 times a day with possible addition of Lopressor-patient will be seen by cardiology again today  3. Diabetes mellitus without complications plan to continue Lantus insulin and short-acting insulin   LOS: 10 days   Vincent Kaufman G 05/13/2015, 6:16 AM

## 2015-05-13 NOTE — Progress Notes (Signed)
ANTIBIOTIC CONSULT NOTE - follow up  Pharmacy Consult for Levaquin Indication: COPD  Today is day #10 of Levaquin Rx. Pt afebrile, no micro data.  Would favor d/c Levaquin at this point as it is likely not providing any additional benefit.  Typical duration of therapy for COPD exacerbation is 5-8 days.  Consider d/c Levaquin to reduce risk of adverse effects from prolonged Rx.    Allergies  Allergen Reactions  . Penicillins Rash   Patient Measurements: Height: 6\' 1"  (185.4 cm) Weight: (!) 309 lb 11.9 oz (140.5 kg) IBW/kg (Calculated) : 79.9   Vital Signs: Temp: 98.3 F (36.8 C) (07/29 0447) Temp Source: Oral (07/29 0447) BP: 112/72 mmHg (07/29 0447) Pulse Rate: 118 (07/29 0447) Intake/Output from previous day: 07/28 0701 - 07/29 0700 In: 720 [P.O.:720] Out: -  Intake/Output from this shift: Total I/O In: 360 [P.O.:360] Out: -   Labs: No results for input(s): WBC, HGB, PLT, LABCREA, CREATININE in the last 72 hours. Estimated Creatinine Clearance: 75.2 mL/min (by C-G formula based on Cr of 1.48). No results for input(s): VANCOTROUGH, VANCOPEAK, VANCORANDOM, GENTTROUGH, GENTPEAK, GENTRANDOM, TOBRATROUGH, TOBRAPEAK, TOBRARND, AMIKACINPEAK, AMIKACINTROU, AMIKACIN in the last 72 hours.   Microbiology: No results found for this or any previous visit (from the past 720 hour(s)).  Medical History: Past Medical History  Diagnosis Date  . Diabetes mellitus without complication   . Hypertension   . Asthma   . CKD (chronic kidney disease)   . AAA (abdominal aortic aneurysm) 2006    Repair  . Aortic valve stenosis 2006   Medications:  Prescriptions prior to admission  Medication Sig Dispense Refill Last Dose  . aspirin EC 81 MG tablet Take 81 mg by mouth daily.   05/03/2015 at Unknown time  . HYDROcodone-acetaminophen (NORCO) 7.5-325 MG per tablet Take 1 tablet by mouth every 4 (four) hours as needed for moderate pain or severe pain.   0 05/03/2015 at Unknown time  . insulin  aspart (NOVOLOG) 100 UNIT/ML FlexPen Inject 20 Units into the skin 3 (three) times daily with meals. Sliding scale   05/03/2015 at Unknown time  . insulin glargine (LANTUS) 100 unit/mL SOPN Inject 20-28 Units into the skin 2 (two) times daily. 28 units in the morning and 20 units before bed   05/02/2015 at Unknown time  . lisinopril (PRINIVIL,ZESTRIL) 10 MG tablet Take 10 mg by mouth daily.   unknown at Unknown time  . simvastatin (ZOCOR) 40 MG tablet Take 40 mg by mouth daily.  3 05/02/2015 at Unknown time  . oxyCODONE-acetaminophen (PERCOCET/ROXICET) 5-325 MG per tablet Take 1 tablet by mouth every 6 (six) hours as needed. 20 tablet 0    Assessment: Acute respiratory failure with hypoxia: Secondary to COPD with exacerbation.  Day #10 Levaquin.   No micro data available   Pt is obese with elevated SCr which has improved.  Normalized clcr > 50.  Today is day #10 of Levaquin Rx. Pt afebrile, no micro data.  Would favor d/c Levaquin at this point as it is likely not providing any additional benefit.  Typical duration of therapy for COPD exacerbation is 5-8 days.  Consider d/c Levaquin to reduce risk of adverse effects from prolonged Rx. Levaquin 7/19 >>  Goal of Therapy:  Eradicate infection  Plan:  Levaquin 750mg  PO q24hrs Duration of therapy per MD - would favor d/c at this point Pharmacy will sign off.   Thank you for allow pharmacy to be a part of this patient's care.  Margo Aye,  Celisse Ciulla A 05/13/2015,10:56 AM

## 2015-05-13 NOTE — Progress Notes (Signed)
Consulting cardiologist: Charlton Haws MD Primary Cardiologist: Dina Rich MD  Cardiology Specific Problem List:  1. Atrial Flutter-CHADS VASC Score of 2.  2. AoV Replacement-Carpenter Edwards 3. Hypertension  Subjective:    Heart rate still elevated, breathing better.   Objective:   Temp:  [97.6 F (36.4 C)-98.3 F (36.8 C)] 98.3 F (36.8 C) (07/29 0447) Pulse Rate:  [118-119] 118 (07/29 0447) Resp:  [18] 18 (07/29 0447) BP: (112-125)/(72-92) 112/72 mmHg (07/29 0447) SpO2:  [94 %-99 %] 96 % (07/29 0724) Weight:  [140.5 kg (309 lb 11.9 oz)] 140.5 kg (309 lb 11.9 oz) (07/29 0447) Last BM Date: 05/11/15  Filed Weights   05/11/15 0522 05/12/15 0439 05/13/15 0447  Weight: 143.8 kg (317 lb 0.3 oz) 141.7 kg (312 lb 6.3 oz) 140.5 kg (309 lb 11.9 oz)    Intake/Output Summary (Last 24 hours) at 05/13/15 0854 Last data filed at 05/12/15 2100  Gross per 24 hour  Intake    720 ml  Output      0 ml  Net    720 ml    Telemetry: Atrial flutter rates in the 120's low as 89 bpm.  Exam:  General: No acute distress.  HEENT: Conjunctiva and lids normal, oropharynx clear.  Lungs: Clear to auscultation, nonlabored.  Cardiac: No elevated JVP or bruits. RRR, no gallop or rub.   Abdomen: Normoactive bowel sounds, nontender, nondistended.  Extremities: No pitting edema, distal pulses full.  Neuropsychiatric: Alert and oriented x3, affect appropriate.   Lab Results:  Basic Metabolic Panel:  Recent Labs Lab 05/07/15 0505 05/10/15 0426  NA 136 135  K 4.4 4.1  CL 106 105  CO2 18* 20*  GLUCOSE 284* 106*  BUN 54* 47*  CREATININE 1.59* 1.48*  CALCIUM 7.9* 7.7*  MG  --  2.5*    Cardiac Enzymes:  Recent Labs Lab 05/09/15 1700 05/09/15 2216 05/10/15 0426  TROPONINI 0.04* 0.04* 0.05*    ECG:   Medications:   Scheduled Medications: . apixaban  5 mg Oral BID  . aspirin EC  81 mg Oral Daily  . atorvastatin  20 mg Oral q1800  . budesonide-formoterol  2  puff Inhalation BID  . diltiazem  90 mg Oral 3 times per day  . insulin aspart  0-15 Units Subcutaneous TID WC  . insulin aspart  0-5 Units Subcutaneous QHS  . insulin aspart  13 Units Subcutaneous TID WC  . insulin glargine  25 Units Subcutaneous BID  . levalbuterol  0.63 mg Nebulization TID  . levofloxacin  750 mg Oral Daily  . lisinopril  10 mg Oral Daily  . metoprolol tartrate  12.5 mg Oral BID  . predniSONE  40 mg Oral Q breakfast  . sodium chloride  3 mL Intravenous Q12H  . tiotropium  18 mcg Inhalation Daily    Infusions:    PRN Medications: sodium chloride, acetaminophen **OR** acetaminophen, alum & mag hydroxide-simeth, guaiFENesin-dextromethorphan, HYDROcodone-acetaminophen, levalbuterol, magnesium hydroxide, ondansetron **OR** ondansetron (ZOFRAN) IV, sodium chloride, sorbitol   Assessment and Plan:   1.Atrial flutter: Rate is not well controlled despite addition of metoprolol. He had been on 50 mg in the past. Will start 12.5 mg BID this am, as this was not started yesterday.  He will continue Eliquis 5 mg BID. Case manager is anxious for him to go home. He is otherwise stable from out standpoint. Will discuss with Dr. Eden Emms need for DCCV as OP once he has been on anticoagulation for 2 weeks. May be  able to go home today once seen by Dr. Eden Emms.  2. COPD with Chronic Dyspnea; Breathing is improved. He had not had sleep study. Consider this due to resistant atrial arrhythmia and body habitus. He is being treated for COPD by Dr. Juanetta Gosling. Consider pulmonary rehab.   3.AVR: [name]: No abnormalities per echo.   Bettey Mare. Lawrence NP [facility]  05/13/2015, 8:54 AM   Patient ambulated well this am and yesterday Reviewed ECG from this am and flutter rate 120   Beta blocker increased  Should be ok to d/c over weekend.  Would arrange DCC 3 weeks after Starting Eliquis.  If fails will be a candidate for ablation  Exam remarkable for obesity  Charlton Haws

## 2015-05-13 NOTE — Care Management Note (Signed)
Case Management Note  Patient Details  Name: Vincent Kaufman MRN: [id number] Date of Birth: Jun 29, 1951  Subjective/Objective:                    Action/Plan:   Expected Discharge Date:                  Expected Discharge Plan:  Home/Self Care  In-House Referral:  NA  Discharge planning Services  CM Consult  Post Acute Care Choice:  NA Choice offered to:  NA  DME Arranged:  Nebulizer machine DME Agency:  [facility]  HH Arranged:    [facility] Agency:     Status of Service:  Completed, signed off  Medicare Important Message Given:    Date Medicare IM Given:    Medicare IM give by:    Date Additional Medicare IM Given:    Additional Medicare Important Message give by:     If discussed at Long Length of Stay Meetings, dates discussed:    Additional Comments: Pts heart rate still uncontrolled with rates in the 120's causing pt to feel SOB. Cardiology still changing and adjusting rate controlled medications. MD stated pt cannot be discharged until pts heart rate better controlled with medications. Will discuss with cardiology in regards to stability of uncontrolled heart rate and discharge. Will continue to follow. Arlyss Queen [place], RN 05/13/2015, 6:49 AM

## 2015-05-13 NOTE — Progress Notes (Signed)
Telemetry Monitor is now reading Normal Sinus Rhythm with heart rate in the 80's, 90's.

## 2015-05-14 LAB — GLUCOSE, CAPILLARY: Glucose-Capillary: 124 mg/dL — ABNORMAL HIGH (ref 65–99)

## 2015-05-14 MED ORDER — LEVOFLOXACIN 750 MG PO TABS: 750.0000 mg | ORAL_TABLET | Freq: Every day | ORAL | Status: DC

## 2015-05-14 MED ORDER — ATORVASTATIN CALCIUM 20 MG PO TABS: 20.0000 mg | ORAL_TABLET | Freq: Every day | ORAL | Status: AC

## 2015-05-14 MED ORDER — APIXABAN 5 MG PO TABS: 5.0000 mg | ORAL_TABLET | Freq: Two times a day (BID) | ORAL | Status: AC

## 2015-05-14 MED ORDER — METOPROLOL TARTRATE 25 MG PO TABS: 12.5000 mg | ORAL_TABLET | Freq: Two times a day (BID) | ORAL | Status: DC

## 2015-05-14 MED ORDER — DILTIAZEM HCL 90 MG PO TABS: 90.0000 mg | ORAL_TABLET | Freq: Three times a day (TID) | ORAL | Status: AC

## 2015-05-14 MED ORDER — PREDNISONE 20 MG PO TABS: 40.0000 mg | ORAL_TABLET | Freq: Every day | ORAL | Status: AC

## 2015-05-14 MED ORDER — BUDESONIDE-FORMOTEROL FUMARATE 160-4.5 MCG/ACT IN AERO: 2.0000 | INHALATION_SPRAY | Freq: Two times a day (BID) | RESPIRATORY_TRACT | Status: AC

## 2015-05-14 NOTE — Progress Notes (Signed)
Patient discharged to home.  IV removed - WNL.  Reviewed medications and instructed to make follow up with PCP and pulmonary.  No questions at this time, verbalizes understanding of all instructions.  Assisted off unit via WC in stable condition.

## 2015-05-14 NOTE — Discharge Summary (Signed)
Physician Discharge Summary  MATTHEWJAMES PETRASEK ZOX:[id number] DOB: May 19, 1951 DOA: 05/03/2015  PCP: Alice Reichert, MD  Admit date: 05/03/2015 Discharge date: 05/14/2015     Discharge Diagnoses:  1. Acute respiratory failure COPD hypoxia 2. Diabetes mellitus without complication 3. Chronic kidney disease 4. Coronary artery disease 5. Atrial flutter 6. Hypertension Discharge Condition: Stable Disposition: Home  Diet recommendation: 2000 Carrie diabetic heart healthy diet  Filed Weights   05/12/15 0439 05/13/15 0447 05/14/15 0500  Weight: 141.7 kg (312 lb 6.3 oz) 140.5 kg (309 lb 11.9 oz) 140.5 kg (309 lb 11.9 oz)    History of present illness:  The patient was admitted through the emergency room after having presented there with shortness of breath and chest pain. It was felt he had acute respiratory failure in the setting of COPD exacerbation workup in the emergency department chest x-ray no active disease EKG showed evidence of atrial tachycardia with ventricular premature beats prior evidence of anterior septal infarct. He was started on Solu-Medrol morphine nebulizer treatments and Levaquin 2 L of oxygen  Hospital Course:  The patient was admitted to MedSurg bed. He was continued on nasal O2 IV medications  and nebulizer treatments. He gradually and slowly improved during his hospital stay. He was seen in consultation by pulmonology also cardiology work with him towards the l last part of f his hospitalization primarily because of atrial flutter. Patient was placed on a anticoagulatestwice a day, Cardizem 90 mg 3 times a day and subsequently metoprolol 12.5 mg twice a day. His heart rates range from 92 1:30. Told to let up of his hospitalization he was feeling much better. Pulmonology recommended follow-up as an outpatient for pulmonary function test the patient was continued on medications listed below   Discharge Instructions The patient is to continue medications listed below  and was instructed to return to primary care physician's office for follow-up in approximately one week    Medication List    TAKE these medications        apixaban 5 MG Tabs tablet  Commonly known as:  ELIQUIS  Take 1 tablet (5 mg total) by mouth 2 (two) times daily.     aspirin EC 81 MG tablet  Take 81 mg by mouth daily.     atorvastatin 20 MG tablet  Commonly known as:  LIPITOR  Take 1 tablet (20 mg total) by mouth daily at 6 PM.     budesonide-formoterol 160-4.5 MCG/ACT inhaler  Commonly known as:  SYMBICORT  Inhale 2 puffs into the lungs 2 (two) times daily.     diltiazem 90 MG tablet  Commonly known as:  CARDIZEM  Take 1 tablet (90 mg total) by mouth 3 (three) times daily.     HYDROcodone-acetaminophen 7.5-325 MG per tablet  Commonly known as:  NORCO  Take 1 tablet by mouth every 4 (four) hours as needed for moderate pain or severe pain.     insulin aspart 100 UNIT/ML FlexPen  Commonly known as:  NOVOLOG  Inject 20 Units into the skin 3 (three) times daily with meals. Sliding scale     insulin glargine 100 unit/mL Sopn  Commonly known as:  LANTUS  Inject 20-28 Units into the skin 2 (two) times daily. 28 units in the morning and 20 units before bed     levofloxacin 750 MG tablet  Commonly known as:  LEVAQUIN  Take 1 tablet (750 mg total) by mouth daily.     lisinopril 10 MG tablet  Commonly known as:  PRINIVIL,ZESTRIL  Take 10 mg by mouth daily.     metoprolol tartrate 25 MG tablet  Commonly known as:  LOPRESSOR  Take 0.5 tablets (12.5 mg total) by mouth 2 (two) times daily.     oxyCODONE-acetaminophen 5-325 MG per tablet  Commonly known as:  PERCOCET/ROXICET  Take 1 tablet by mouth every 6 (six) hours as needed.     predniSONE 20 MG tablet  Commonly known as:  DELTASONE  Take 2 tablets (40 mg total) by mouth daily with breakfast.     simvastatin 40 MG tablet  Commonly known as:  ZOCOR  Take 40 mg by mouth daily.       Allergies  Allergen  Reactions  . Penicillins Rash    The results of significant diagnostics from this hospitalization (including imaging, microbiology, ancillary and laboratory) are listed below for reference.    Significant Diagnostic Studies: Dg Chest 2 View  05/09/2015   CLINICAL DATA:  Shortness of breath.  EXAM: CHEST  2 VIEW  COMPARISON:  May 03, 2015.  FINDINGS: Stable cardiomediastinal silhouette. Sternotomy wires are noted status post cardiac valve repair. Right axillary surgical clips are noted. No pneumothorax or pleural effusion is noted. No acute pulmonary disease is noted.  IMPRESSION: No active cardiopulmonary disease.   Electronically Signed   By: Lupita Raider, M.D.   On: 05/09/2015 10:40   Dg Chest Port 1 View  05/03/2015   CLINICAL DATA:  Shortness of Breath  EXAM: PORTABLE CHEST - 1 VIEW  COMPARISON:  05/26/2012  FINDINGS: Borderline cardiomegaly. Status post median sternotomy. No acute infiltrate or pleural effusion. No pulmonary edema.  IMPRESSION: No active disease. Borderline cardiomegaly. Status post median sternotomy.   Electronically Signed   By: Natasha Mead M.D.   On: 05/03/2015 12:22    Microbiology: No results found for this or any previous visit (from the past 240 hour(s)).   Labs: Basic Metabolic Panel:  Recent Labs Lab 05/10/15 0426  NA 135  K 4.1  CL 105  CO2 20*  GLUCOSE 106*  BUN 47*  CREATININE 1.48*  CALCIUM 7.7*  MG 2.5*   Liver Function Tests: No results for input(s): AST, ALT, ALKPHOS, BILITOT, PROT, ALBUMIN in the last 168 hours. No results for input(s): LIPASE, AMYLASE in the last 168 hours. No results for input(s): AMMONIA in the last 168 hours. CBC: No results for input(s): WBC, NEUTROABS, HGB, HCT, MCV, PLT in the last 168 hours. Cardiac Enzymes:  Recent Labs Lab 05/09/15 1700 05/09/15 2216 05/10/15 0426  TROPONINI 0.04* 0.04* 0.05*   BNP: BNP (last 3 results)  Recent Labs  05/03/15 1203 05/09/15 0908  BNP 220.0* 81.0    ProBNP  (last 3 results) No results for input(s): PROBNP in the last 8760 hours.  CBG:  Recent Labs Lab 05/13/15 0727 05/13/15 1108 05/13/15 1623 05/13/15 2222 05/14/15 0800  GLUCAP 131* 220* 176* 153* 124*    Principal Problem:   Acute respiratory failure with hypoxia Active Problems:   COPD (chronic obstructive pulmonary disease)   COPD with exacerbation   Sinus tachycardia   Diabetes mellitus without complication   Hypertension   Coronary artery disease   Chest pain   CKD (chronic kidney disease)   Obesity   Acute respiratory failure   Atrial flutter   Time coordinating discharge: 45 minutes  Signed:  Butch Penny, MD 05/14/2015, 8:14 AM

## 2015-05-27 ENCOUNTER — Ambulatory Visit (INDEPENDENT_AMBULATORY_CARE_PROVIDER_SITE_OTHER): Payer: Medicaid Other | Admitting: Cardiovascular Disease

## 2015-05-27 ENCOUNTER — Encounter: Payer: Self-pay | Admitting: Cardiovascular Disease

## 2015-05-27 VITALS — BP 140/80 | HR 65 | Ht 73.0 in | Wt 311.6 lb

## 2015-05-27 DIAGNOSIS — Z954 Presence of other heart-valve replacement: Secondary | ICD-10-CM

## 2015-05-27 DIAGNOSIS — I10 Essential (primary) hypertension: Secondary | ICD-10-CM | POA: Diagnosis not present

## 2015-05-27 DIAGNOSIS — Z7901 Long term (current) use of anticoagulants: Secondary | ICD-10-CM

## 2015-05-27 DIAGNOSIS — Z87898 Personal history of other specified conditions: Secondary | ICD-10-CM

## 2015-05-27 DIAGNOSIS — I4892 Unspecified atrial flutter: Secondary | ICD-10-CM

## 2015-05-27 DIAGNOSIS — Z9289 Personal history of other medical treatment: Secondary | ICD-10-CM

## 2015-05-27 DIAGNOSIS — Z952 Presence of prosthetic heart valve: Secondary | ICD-10-CM

## 2015-05-27 DIAGNOSIS — Z5181 Encounter for therapeutic drug level monitoring: Secondary | ICD-10-CM

## 2015-05-27 MED ORDER — METOPROLOL SUCCINATE ER 25 MG PO TB24: 25.0000 mg | ORAL_TABLET | Freq: Two times a day (BID) | ORAL | Status: AC

## 2015-05-27 NOTE — Patient Instructions (Signed)
Your physician recommends that you schedule a follow-up appointment in: 1 week for EKG   Your physician has recommended you make the following change in your medication:   STOP TAKING LOPRESSOR 25   START TAKING TOPROL XL 25 MG TWO TIMES DAILY STARTING TONIGHT  Thank you for choosing [facility]!

## 2015-05-27 NOTE — Progress Notes (Signed)
Patient ID: Vincent Kaufman, male   DOB: [birth year], 64 y.o.   MRN: [id number]      SUBJECTIVE: Pt of Dr. Wyline Mood added on to my schedule for post-hospitalization follow up. Had rapid atrial flutter and COPD exacerbation. Has h/o AVR. Anticoagulated with apixaban.  Feels well. Denies chest pain and palpitations. Says "breathing is much better".  ECG performed in office today demonstrates rapid atrial flutter with 2:1 conduction, HR 128 bpm.   Review of Systems: As per "subjective", otherwise negative.  Allergies  Allergen Reactions  . Penicillins Rash    Current Outpatient Prescriptions  Medication Sig Dispense Refill  . apixaban (ELIQUIS) 5 MG TABS tablet Take 1 tablet (5 mg total) by mouth 2 (two) times daily. 60 tablet 5  . aspirin EC 81 MG tablet Take 81 mg by mouth daily.    Marland Kitchen atorvastatin (LIPITOR) 20 MG tablet Take 1 tablet (20 mg total) by mouth daily at 6 PM. 30 tablet 5  . budesonide-formoterol (SYMBICORT) 160-4.5 MCG/ACT inhaler Inhale 2 puffs into the lungs 2 (two) times daily. 1 Inhaler 12  . diltiazem (CARDIZEM) 90 MG tablet Take 1 tablet (90 mg total) by mouth 3 (three) times daily. 90 tablet 5  . HYDROcodone-acetaminophen (NORCO) 7.5-325 MG per tablet Take 1 tablet by mouth every 4 (four) hours as needed for moderate pain or severe pain.   0  . insulin aspart (NOVOLOG) 100 UNIT/ML FlexPen Inject 20 Units into the skin 3 (three) times daily with meals. Sliding scale    . insulin glargine (LANTUS) 100 unit/mL SOPN Inject 20-28 Units into the skin 2 (two) times daily. 28 units in the morning and 20 units before bed    . levofloxacin (LEVAQUIN) 750 MG tablet Take 1 tablet (750 mg total) by mouth daily. 7 tablet 0  . lisinopril (PRINIVIL,ZESTRIL) 10 MG tablet Take 10 mg by mouth daily.    . metoprolol tartrate (LOPRESSOR) 25 MG tablet Take 0.5 tablets (12.5 mg total) by mouth 2 (two) times daily. 20 tablet 3  . oxyCODONE-acetaminophen (PERCOCET/ROXICET) 5-325 MG per tablet  Take 1 tablet by mouth every 6 (six) hours as needed. 20 tablet 0  . predniSONE (DELTASONE) 20 MG tablet Take 2 tablets (40 mg total) by mouth daily with breakfast. 60 tablet 1  . simvastatin (ZOCOR) 40 MG tablet Take 40 mg by mouth daily.  3   No current facility-administered medications for this visit.    Past Medical History  Diagnosis Date  . Diabetes mellitus without complication   . Hypertension   . Asthma   . CKD (chronic kidney disease)   . AAA (abdominal aortic aneurysm) 2006    Repair  . Aortic valve stenosis 2006    Past Surgical History  Procedure Laterality Date  . Abdominal aortic aneurysm repair  2006    [facility]  . Aortic valve replacement  2006    [name]    Social History   Social History  . Marital Status: Divorced    Spouse Name: N/A  . Number of Children: N/A  . Years of Education: N/A   Occupational History  . Not on file.   Social History Main Topics  . Smoking status: Former Smoker    Quit date: 10/15/2004  . Smokeless tobacco: Not on file  . Alcohol Use: No  . Drug Use: No  . Sexual Activity: Not on file   Other Topics Concern  . Not on file   Social History Narrative  Filed Vitals:   05/27/15 0822  BP: 140/80  Pulse: 65  Height:  (1.854 m)  Weight: 311 lb 9.6 oz (141.341 kg)  SpO2: 98%   HR by auscultation: 120 bpm  PHYSICAL EXAM General: NAD HEENT: Normal. Neck: No JVD, no thyromegaly. Lungs: No rales or wheezes. CV: Tachycardic, regular rhythm, normal S1/S2, no S3/S4, no murmur.Trace periankle edema.   Abdomen: Soft, obese, no distention.  Neurologic: Alert and oriented x 3.  Psych: Normal affect. Skin: Normal. Musculoskeletal: No gross deformities. Extremities: No clubbing or cyanosis.   ECG: Most recent ECG reviewed.      ASSESSMENT AND PLAN: 1. Rapid atrial flutter: Will switch metoprolol tartrate to succinate and increase to 25 mg bid. He is already taking diltiazem 90 mg tid. It  appears he has been anticoagulated since 7/28. I will have him return in one week for an ECG. If he remains in rapid atrial flutter, would suggest direct current cardioversion as he would have been appropriately anticoagulated for 3 weeks. If this fails, would refer to EP for ablation.  2. S/p AVR: Mean gradient 18 mmHg by echo on 7/20, no paravalular leak.  3. Essential HTN: Controlled. Monitor given med adjustments noted above.  Dispo: f/u with Dr. Wyline Mood   Time spent: 40 minutes, of which greater than 50% was spent reviewing symptoms, relevant blood tests and studies, and discussing management plan with the patient.   Prentice Docker, M.D., F.A.C.C.

## 2015-05-29 ENCOUNTER — Encounter (HOSPITAL_COMMUNITY): Payer: Self-pay | Admitting: Emergency Medicine

## 2015-05-29 ENCOUNTER — Inpatient Hospital Stay (HOSPITAL_COMMUNITY)
Admission: EM | Admit: 2015-05-29 | Discharge: 2015-06-16 | DRG: 377 | Disposition: E | Payer: Medicaid Other | Attending: Family Medicine | Admitting: Family Medicine

## 2015-05-29 DIAGNOSIS — N184 Chronic kidney disease, stage 4 (severe): Secondary | ICD-10-CM | POA: Diagnosis present

## 2015-05-29 DIAGNOSIS — I469 Cardiac arrest, cause unspecified: Secondary | ICD-10-CM | POA: Diagnosis not present

## 2015-05-29 DIAGNOSIS — I248 Other forms of acute ischemic heart disease: Secondary | ICD-10-CM | POA: Diagnosis present

## 2015-05-29 DIAGNOSIS — E1122 Type 2 diabetes mellitus with diabetic chronic kidney disease: Secondary | ICD-10-CM | POA: Diagnosis present

## 2015-05-29 DIAGNOSIS — Z794 Long term (current) use of insulin: Secondary | ICD-10-CM

## 2015-05-29 DIAGNOSIS — Z79891 Long term (current) use of opiate analgesic: Secondary | ICD-10-CM

## 2015-05-29 DIAGNOSIS — N289 Disorder of kidney and ureter, unspecified: Secondary | ICD-10-CM

## 2015-05-29 DIAGNOSIS — Z79899 Other long term (current) drug therapy: Secondary | ICD-10-CM

## 2015-05-29 DIAGNOSIS — I4430 Unspecified atrioventricular block: Secondary | ICD-10-CM | POA: Diagnosis present

## 2015-05-29 DIAGNOSIS — E872 Acidosis, unspecified: Secondary | ICD-10-CM

## 2015-05-29 DIAGNOSIS — K922 Gastrointestinal hemorrhage, unspecified: Principal | ICD-10-CM | POA: Diagnosis present

## 2015-05-29 DIAGNOSIS — R7989 Other specified abnormal findings of blood chemistry: Secondary | ICD-10-CM

## 2015-05-29 DIAGNOSIS — R578 Other shock: Secondary | ICD-10-CM | POA: Diagnosis not present

## 2015-05-29 DIAGNOSIS — Z01818 Encounter for other preprocedural examination: Secondary | ICD-10-CM

## 2015-05-29 DIAGNOSIS — Z952 Presence of prosthetic heart valve: Secondary | ICD-10-CM

## 2015-05-29 DIAGNOSIS — Z66 Do not resuscitate: Secondary | ICD-10-CM | POA: Diagnosis present

## 2015-05-29 DIAGNOSIS — Z7901 Long term (current) use of anticoagulants: Secondary | ICD-10-CM

## 2015-05-29 DIAGNOSIS — I4892 Unspecified atrial flutter: Secondary | ICD-10-CM | POA: Diagnosis present

## 2015-05-29 DIAGNOSIS — I129 Hypertensive chronic kidney disease with stage 1 through stage 4 chronic kidney disease, or unspecified chronic kidney disease: Secondary | ICD-10-CM | POA: Diagnosis present

## 2015-05-29 DIAGNOSIS — Z6841 Body Mass Index (BMI) 40.0 and over, adult: Secondary | ICD-10-CM

## 2015-05-29 DIAGNOSIS — Z7982 Long term (current) use of aspirin: Secondary | ICD-10-CM

## 2015-05-29 DIAGNOSIS — E119 Type 2 diabetes mellitus without complications: Secondary | ICD-10-CM

## 2015-05-29 DIAGNOSIS — Z87891 Personal history of nicotine dependence: Secondary | ICD-10-CM

## 2015-05-29 DIAGNOSIS — D696 Thrombocytopenia, unspecified: Secondary | ICD-10-CM | POA: Diagnosis present

## 2015-05-29 DIAGNOSIS — J9690 Respiratory failure, unspecified, unspecified whether with hypoxia or hypercapnia: Secondary | ICD-10-CM | POA: Diagnosis not present

## 2015-05-29 DIAGNOSIS — I959 Hypotension, unspecified: Secondary | ICD-10-CM | POA: Diagnosis not present

## 2015-05-29 DIAGNOSIS — R778 Other specified abnormalities of plasma proteins: Secondary | ICD-10-CM

## 2015-05-29 DIAGNOSIS — D62 Acute posthemorrhagic anemia: Secondary | ICD-10-CM | POA: Diagnosis present

## 2015-05-29 DIAGNOSIS — D649 Anemia, unspecified: Secondary | ICD-10-CM | POA: Diagnosis present

## 2015-05-29 DIAGNOSIS — N189 Chronic kidney disease, unspecified: Secondary | ICD-10-CM | POA: Diagnosis present

## 2015-05-29 DIAGNOSIS — Z7951 Long term (current) use of inhaled steroids: Secondary | ICD-10-CM

## 2015-05-29 DIAGNOSIS — J45909 Unspecified asthma, uncomplicated: Secondary | ICD-10-CM | POA: Diagnosis present

## 2015-05-29 MED ORDER — SODIUM CHLORIDE 0.9 % IV SOLN
1000.0000 mL | INTRAVENOUS | Status: DC
  Administered 2015-05-30: 1000 mL via INTRAVENOUS

## 2015-05-29 MED ORDER — SODIUM CHLORIDE 0.9 % IV SOLN
1000.0000 mL | Freq: Once | INTRAVENOUS | Status: AC
  Administered 2015-05-30: 1000 mL via INTRAVENOUS

## 2015-05-29 NOTE — ED Notes (Addendum)
Pt. Reports dizziness starting 2 hours ago. Pt. Reports worse with standing and movement. Per EMS [phone]. Pt. With 20G to right AC, NS running KVO.

## 2015-05-29 NOTE — ED Provider Notes (Signed)
History  This chart was scribed for Dione Booze, MD by Karle Plumber, ED Scribe. This patient was seen in room APA09/APA09 and the patient's care was started at 11:43 PM.  Chief Complaint  Patient presents with  . Dizziness   The history is provided by the patient and medical records. No language interpreter was used.    HPI Comments:  Vincent Kaufman is a 64 y.o. obese male brought in by EMS, who presents to the Emergency Department complaining of SOB and bilateral feet swelling that began this morning. He reports associated dizziness that began approximately two hours ago. He also reports associated diaphoresis and nausea. Pt states he had a syncopal episode and is unsure how long he was unconscious. He reports normal bowel habits but reports it looks like tar for the past two weeks secondary to being on new medication (Eliquis) after being hospitalized 13 days. He denies modifying factors of the symptoms. He denies CP, fever, chills or vomiting. He reports anticoagulant therapy of Eliquis. PMHx of DM, HTN, asthma, CKD and aortic valve stenosis. Past surgical h/o AAA repair. PCP is Dr. Renard Matter.  Past Medical History  Diagnosis Date  . Diabetes mellitus without complication   . Hypertension   . Asthma   . CKD (chronic kidney disease)   . AAA (abdominal aortic aneurysm) 2006    Repair  . Aortic valve stenosis 2006   Past Surgical History  Procedure Laterality Date  . Abdominal aortic aneurysm repair  2006    [facility]  . Aortic valve replacement  2006    [name]   Family History  Problem Relation Age of Onset  . Rheum arthritis Mother    Social History  Substance Use Topics  . Smoking status: Former Smoker    Quit date: 10/15/2004  . Smokeless tobacco: None  . Alcohol Use: No    Review of Systems  Constitutional: Negative for fever and chills.  Respiratory: Positive for shortness of breath.   Cardiovascular: Positive for leg swelling. Negative for chest  pain.  Gastrointestinal: Positive for nausea. Negative for vomiting.  Neurological: Positive for dizziness and syncope.  All other systems reviewed and are negative.   Allergies  Penicillins  Home Medications   Prior to Admission medications   Medication Sig Start Date End Date Taking? Authorizing Provider  apixaban (ELIQUIS) 5 MG TABS tablet Take 1 tablet (5 mg total) by mouth 2 (two) times daily. 05/14/15   Butch Penny, MD  aspirin EC 81 MG tablet Take 81 mg by mouth daily.    Historical Provider, MD  atorvastatin (LIPITOR) 20 MG tablet Take 1 tablet (20 mg total) by mouth daily at 6 PM. 05/14/15   Angus McInnis, MD  budesonide-formoterol (SYMBICORT) 160-4.5 MCG/ACT inhaler Inhale 2 puffs into the lungs 2 (two) times daily. 05/14/15   Butch Penny, MD  diltiazem (CARDIZEM) 90 MG tablet Take 1 tablet (90 mg total) by mouth 3 (three) times daily. 05/14/15   Butch Penny, MD  HYDROcodone-acetaminophen (NORCO) 7.5-325 MG per tablet Take 1 tablet by mouth every 4 (four) hours as needed for moderate pain or severe pain.  12/10/14   Historical Provider, MD  insulin aspart (NOVOLOG) 100 UNIT/ML FlexPen Inject 20 Units into the skin 3 (three) times daily with meals. Sliding scale    Historical Provider, MD  insulin glargine (LANTUS) 100 unit/mL SOPN Inject 20-28 Units into the skin 2 (two) times daily. 28 units in the morning and 20 units before bed  Historical Provider, MD  levofloxacin (LEVAQUIN) 750 MG tablet Take 1 tablet (750 mg total) by mouth daily. 05/14/15   Angus McInnis, MD  lisinopril (PRINIVIL,ZESTRIL) 10 MG tablet Take 10 mg by mouth daily.    Historical Provider, MD  metoprolol succinate (TOPROL XL) 25 MG 24 hr tablet Take 1 tablet (25 mg total) by mouth 2 (two) times daily. 05/27/15   Laqueta Linden, MD  oxyCODONE-acetaminophen (PERCOCET/ROXICET) 5-325 MG per tablet Take 1 tablet by mouth every 6 (six) hours as needed. 12/18/14   Bethann Berkshire, MD  predniSONE (DELTASONE) 20 MG  tablet Take 2 tablets (40 mg total) by mouth daily with breakfast. 05/14/15   Butch Penny, MD  simvastatin (ZOCOR) 40 MG tablet Take 40 mg by mouth daily. 11/15/14   Historical Provider, MD   Triage Vitals: BP 105/75 mmHg  Pulse 135  Temp(Src) 97.7 F (36.5 C) (Oral)  Resp 22  Ht 6\' 1"  (1.854 m)  Wt 308 lb (139.708 kg)  BMI 40.64 kg/m2  SpO2 100% Physical Exam  Constitutional: He is oriented to person, place, and time. He appears well-developed and well-nourished.  HENT:  Head: Normocephalic and atraumatic.  Eyes: EOM are normal. Pupils are equal, round, and reactive to light.  Neck: Normal range of motion. Neck supple. No JVD present.  Cardiovascular: Tachycardia present.   No murmur heard. Pulmonary/Chest: Effort normal and breath sounds normal. He has no wheezes. He has no rales. He exhibits no tenderness.  Abdominal: Soft. Bowel sounds are normal. He exhibits no distension and no mass. There is no tenderness.  Genitourinary:  Normal sphincter tone. Small amount of dark stool that is strongly hemoccult positive.  Musculoskeletal: Normal range of motion. He exhibits edema.  Trace edema with mild venostasis changes.  Lymphadenopathy:    He has no cervical adenopathy.  Neurological: He is alert and oriented to person, place, and time. No cranial nerve deficit. He exhibits normal muscle tone. Coordination normal.  Skin: Skin is warm and dry. No rash noted.  Psychiatric: He has a normal mood and affect. His behavior is normal. Judgment and thought content normal.  Nursing note and vitals reviewed.   ED Course  Procedures (including critical care time) DIAGNOSTIC STUDIES: Oxygen Saturation is 100% on RA, normal by my interpretation.   COORDINATION OF CARE: 11:53 PM- Will order IV fluids, labs and CXR and perform rectal exam. Pt verbalizes understanding and agrees to plan.  Medications  pantoprazole (PROTONIX) 80 mg in sodium chloride 0.9 % 250 mL (0.32 mg/mL) infusion (8 mg/hr  Intravenous Restarted 05/30/15 0145)  pantoprazole (PROTONIX) injection 40 mg (not administered)  metoprolol succinate (TOPROL-XL) 24 hr tablet 25 mg (25 mg Oral Given 05/30/15 0331)  diltiazem (CARDIZEM) tablet 90 mg (not administered)  acetaminophen (TYLENOL) tablet 650 mg (not administered)    Or  acetaminophen (TYLENOL) suppository 650 mg (not administered)  oxyCODONE (Oxy IR/ROXICODONE) immediate release tablet 5 mg (not administered)  HYDROmorphone (DILAUDID) injection 0.5-1 mg (not administered)  ondansetron (ZOFRAN) tablet 4 mg (not administered)    Or  ondansetron (ZOFRAN) injection 4 mg (not administered)  alum & mag hydroxide-simeth (MAALOX/MYLANTA) 200-200-20 MG/5ML suspension 30 mL (not administered)  0.9 %  sodium chloride infusion ( Intravenous Restarted 05/30/15 0330)  atorvastatin (LIPITOR) tablet 20 mg (not administered)  insulin glargine (LANTUS) Solostar Pen 28 Units (not administered)  insulin glargine (LANTUS) injection 20 Units (not administered)  sodium chloride 0.9 % bolus 500 mL (not administered)  0.9 %  sodium chloride infusion (not  administered)  acetaminophen (TYLENOL) tablet 650 mg (not administered)  furosemide (LASIX) injection 20 mg (not administered)  diltiazem (CARDIZEM) 1 mg/mL load via infusion 10 mg (not administered)    And  diltiazem (CARDIZEM) 100 mg in dextrose 5 % 100 mL (1 mg/mL) infusion (not administered)  0.9 %  sodium chloride infusion (0 mLs Intravenous Stopped 05/30/15 0041)  pantoprazole (PROTONIX) 80 mg in sodium chloride 0.9 % 100 mL IVPB (80 mg Intravenous New Bag/Given 05/30/15 0240)   Labs Review Results for orders placed or performed during the hospital encounter of 06-26-15  Basic metabolic panel  Result Value Ref Range   Sodium 137 135 - 145 mmol/L   Potassium 4.4 3.5 - 5.1 mmol/L   Chloride 110 101 - 111 mmol/L   CO2 16 (L) 22 - 32 mmol/L   Glucose, Bld 179 (H) 65 - 99 mg/dL   BUN 74 (H) 6 - 20 mg/dL   Creatinine, Ser 1.61  (H) 0.61 - 1.24 mg/dL   Calcium 8.1 (L) 8.9 - 10.3 mg/dL   GFR calc non Af Amer 38 (L) >60 mL/min   GFR calc Af Amer 44 (L) >60 mL/min   Anion gap 11 5 - 15  CBC  Result Value Ref Range   WBC 14.2 (H) 4.0 - 10.5 K/uL   RBC 2.82 (L) 4.22 - 5.81 MIL/uL   Hemoglobin 8.9 (L) 13.0 - 17.0 g/dL   HCT 09.6 (L) 04.5 - 40.9 %   MCV 93.6 78.0 - 100.0 fL   MCH 31.6 26.0 - 34.0 pg   MCHC 33.7 30.0 - 36.0 g/dL   RDW 81.1 91.4 - 78.2 %   Platelets 139 (L) 150 - 400 K/uL  Urinalysis, Routine w reflex microscopic (not at [facility])  Result Value Ref Range   Color, Urine YELLOW YELLOW   APPearance CLEAR CLEAR   Specific Gravity, Urine 1.020 1.005 - 1.030   pH 5.5 5.0 - 8.0   Glucose, UA NEGATIVE NEGATIVE mg/dL   Hgb urine dipstick NEGATIVE NEGATIVE   Bilirubin Urine NEGATIVE NEGATIVE   Ketones, ur NEGATIVE NEGATIVE mg/dL   Protein, ur NEGATIVE NEGATIVE mg/dL   Urobilinogen, UA 0.2 0.0 - 1.0 mg/dL   Nitrite NEGATIVE NEGATIVE   Leukocytes, UA NEGATIVE NEGATIVE  Troponin I  Result Value Ref Range   Troponin I 0.05 (H) <0.031 ng/mL  Brain natriuretic peptide  Result Value Ref Range   B Natriuretic Peptide 82.0 0.0 - 100.0 pg/mL  Hemoglobin and hematocrit, blood  Result Value Ref Range   Hemoglobin 7.9 (L) 13.0 - 17.0 g/dL   HCT 95.6 (L) 21.3 - 08.6 %  Troponin I (q 6hr x 3)  Result Value Ref Range   Troponin I 0.05 (H) <0.031 ng/mL  Basic metabolic panel  Result Value Ref Range   Sodium 137 135 - 145 mmol/L   Potassium 4.0 3.5 - 5.1 mmol/L   Chloride 113 (H) 101 - 111 mmol/L   CO2 17 (L) 22 - 32 mmol/L   Glucose, Bld 185 (H) 65 - 99 mg/dL   BUN 83 (H) 6 - 20 mg/dL   Creatinine, Ser 5.78 (H) 0.61 - 1.24 mg/dL   Calcium 7.7 (L) 8.9 - 10.3 mg/dL   GFR calc non Af Amer 39 (L) >60 mL/min   GFR calc Af Amer 45 (L) >60 mL/min   Anion gap 7 5 - 15  CBC  Result Value Ref Range   WBC 14.1 (H) 4.0 - 10.5 K/uL   RBC 2.38 (  L) 4.22 - 5.81 MIL/uL   Hemoglobin 7.5 (L) 13.0 - 17.0 g/dL   HCT 16.1  (L) 09.6 - 52.0 %   MCV 92.4 78.0 - 100.0 fL   MCH 31.5 26.0 - 34.0 pg   MCHC 34.1 30.0 - 36.0 g/dL   RDW 04.5 40.9 - 81.1 %   Platelets 121 (L) 150 - 400 K/uL  Glucose, capillary  Result Value Ref Range   Glucose-Capillary 172 (H) 65 - 99 mg/dL  CBG monitoring, ED  Result Value Ref Range   Glucose-Capillary 175 (H) 65 - 99 mg/dL  POC occult blood, ED  Result Value Ref Range   Fecal Occult Bld POSITIVE (A) NEGATIVE  I-Stat CG4 Lactic Acid, ED  Result Value Ref Range   Lactic Acid, Venous 2.09 (HH) 0.5 - 2.0 mmol/L   Comment NOTIFIED PHYSICIAN   Type and screen  Result Value Ref Range   ABO/RH(D) O POS    Antibody Screen NEG    Sample Expiration 06/01/2015    Unit Number [id number]    Blood Component Type RED CELLS,LR    Unit division 00    Status of Unit ALLOCATED    Transfusion Status OK TO TRANSFUSE    Crossmatch Result Compatible    Unit Number [id number]    Blood Component Type RED CELLS,LR    Unit division 00    Status of Unit ALLOCATED    Transfusion Status OK TO TRANSFUSE    Crossmatch Result Compatible   Prepare RBC  Result Value Ref Range   Order Confirmation ORDER PROCESSED BY BLOOD BANK   ABO/Rh  Result Value Ref Range   ABO/RH(D) O POS    Imaging Review Dg Chest Port 1 View  05/30/2015   CLINICAL DATA:  Shortness of breath  EXAM: PORTABLE CHEST - 1 VIEW  COMPARISON:  05/09/2015  FINDINGS: Stable cardiomegaly and aortic tortuosity in this patient post aortic valve replacement. There is no edema, consolidation, effusion, or pneumothorax. No osseous findings to explain chest pain.  IMPRESSION: Stable exam.  No evidence of acute cardiopulmonary disease.   Electronically Signed   By: Marnee Spring M.D.   On: 05/30/2015 01:01      EKG Interpretation   Date/Time:  Sunday Jun 09, 2015 23:21:47 EDT Ventricular Rate:  134 PR Interval:  155 QRS Duration: 96 QT Interval:  299 QTC Calculation: 446 R Axis:   -15 Text Interpretation:  Atrial flutter  with 2 to 1 block Borderline left  axis deviation Low voltage, extremity leads Anteroseptal infarct, old ST  depr, consider ischemia, inferior leads When compared with ECG of  05/13/2015, No significant change was found Confirmed by [facility]  MD, Andi Layfield  ([id number]) on 05/30/2015 5:13:38 AM      CRITICAL CARE Performed by: GMWNU,UVOZD Total critical care time: 50 minutes Critical care time was exclusive of separately billable procedures and treating other patients. Critical care was necessary to treat or prevent imminent or life-threatening deterioration. Critical care was time spent personally by me on the following activities: development of treatment plan with patient and/or surrogate as well as nursing, discussions with consultants, evaluation of patient's response to treatment, examination of patient, obtaining history from patient or surrogate, ordering and performing treatments and interventions, ordering and review of laboratory studies, ordering and review of radiographic studies, pulse oximetry and re-evaluation of patient's condition.  MDM   Final diagnoses:  Upper gastrointestinal bleed  Anticoagulant long-term use  Metabolic acidosis  Renal insufficiency  Anemia due to acute blood  loss  Elevated troponin I level    GI bleeding which is probably upper GI based on color stool. This appears to have begun with onset of anticoagulation therapy and does not appear to be an acute bleed. There has been a significant drop in his hemoglobin from baseline and BUN has risen dramatically. Renal insufficiency is essentially unchanged. He has a metabolic acidosis without an increased anion gap. Lactic acid level is minimally elevated. Platelet count is slightly diminished but unchanged from baseline and not a clinical factor in his bleeding. He will need to be admitted for monitoring of his hemoglobins and consideration for endoscopy. Unfortunately, apixaban does not have an antidote. Since this  appears to be a slow bleed, he will be monitored. Elevated troponin is felt to be due to demand ischemia. Case is discussed with Dr. Lovell Sheehan of [facility] agrees to admit the patient to a stepdown bed.  I personally performed the services described in this documentation, which was scribed in my presence. The recorded information has been reviewed and is accurate.    Dione Booze, MD 05/30/15 [phone]

## 2015-05-30 ENCOUNTER — Inpatient Hospital Stay (HOSPITAL_COMMUNITY): Payer: Medicaid Other

## 2015-05-30 ENCOUNTER — Encounter (HOSPITAL_COMMUNITY): Payer: Medicaid Other

## 2015-05-30 ENCOUNTER — Emergency Department (HOSPITAL_COMMUNITY): Payer: Medicaid Other

## 2015-05-30 ENCOUNTER — Encounter (HOSPITAL_COMMUNITY): Payer: Self-pay

## 2015-05-30 DIAGNOSIS — Z79891 Long term (current) use of opiate analgesic: Secondary | ICD-10-CM | POA: Diagnosis not present

## 2015-05-30 DIAGNOSIS — I248 Other forms of acute ischemic heart disease: Secondary | ICD-10-CM | POA: Diagnosis present

## 2015-05-30 DIAGNOSIS — Z87891 Personal history of nicotine dependence: Secondary | ICD-10-CM | POA: Diagnosis not present

## 2015-05-30 DIAGNOSIS — Z7951 Long term (current) use of inhaled steroids: Secondary | ICD-10-CM | POA: Diagnosis not present

## 2015-05-30 DIAGNOSIS — J9690 Respiratory failure, unspecified, unspecified whether with hypoxia or hypercapnia: Secondary | ICD-10-CM | POA: Diagnosis not present

## 2015-05-30 DIAGNOSIS — K922 Gastrointestinal hemorrhage, unspecified: Secondary | ICD-10-CM | POA: Diagnosis present

## 2015-05-30 DIAGNOSIS — R578 Other shock: Secondary | ICD-10-CM | POA: Diagnosis not present

## 2015-05-30 DIAGNOSIS — I4430 Unspecified atrioventricular block: Secondary | ICD-10-CM | POA: Diagnosis present

## 2015-05-30 DIAGNOSIS — J4540 Moderate persistent asthma, uncomplicated: Secondary | ICD-10-CM

## 2015-05-30 DIAGNOSIS — E1122 Type 2 diabetes mellitus with diabetic chronic kidney disease: Secondary | ICD-10-CM | POA: Diagnosis present

## 2015-05-30 DIAGNOSIS — D500 Iron deficiency anemia secondary to blood loss (chronic): Secondary | ICD-10-CM | POA: Diagnosis not present

## 2015-05-30 DIAGNOSIS — D62 Acute posthemorrhagic anemia: Secondary | ICD-10-CM

## 2015-05-30 DIAGNOSIS — Z952 Presence of prosthetic heart valve: Secondary | ICD-10-CM | POA: Diagnosis not present

## 2015-05-30 DIAGNOSIS — N189 Chronic kidney disease, unspecified: Secondary | ICD-10-CM

## 2015-05-30 DIAGNOSIS — I129 Hypertensive chronic kidney disease with stage 1 through stage 4 chronic kidney disease, or unspecified chronic kidney disease: Secondary | ICD-10-CM | POA: Diagnosis present

## 2015-05-30 DIAGNOSIS — I4892 Unspecified atrial flutter: Secondary | ICD-10-CM | POA: Diagnosis present

## 2015-05-30 DIAGNOSIS — Z794 Long term (current) use of insulin: Secondary | ICD-10-CM | POA: Diagnosis not present

## 2015-05-30 DIAGNOSIS — R7989 Other specified abnormal findings of blood chemistry: Secondary | ICD-10-CM

## 2015-05-30 DIAGNOSIS — Z7901 Long term (current) use of anticoagulants: Secondary | ICD-10-CM | POA: Insufficient documentation

## 2015-05-30 DIAGNOSIS — K921 Melena: Secondary | ICD-10-CM | POA: Diagnosis present

## 2015-05-30 DIAGNOSIS — E872 Acidosis: Secondary | ICD-10-CM | POA: Diagnosis not present

## 2015-05-30 DIAGNOSIS — I959 Hypotension, unspecified: Secondary | ICD-10-CM | POA: Diagnosis not present

## 2015-05-30 DIAGNOSIS — E119 Type 2 diabetes mellitus without complications: Secondary | ICD-10-CM

## 2015-05-30 DIAGNOSIS — D696 Thrombocytopenia, unspecified: Secondary | ICD-10-CM | POA: Diagnosis present

## 2015-05-30 DIAGNOSIS — I469 Cardiac arrest, cause unspecified: Secondary | ICD-10-CM | POA: Diagnosis not present

## 2015-05-30 DIAGNOSIS — Z66 Do not resuscitate: Secondary | ICD-10-CM | POA: Diagnosis present

## 2015-05-30 DIAGNOSIS — N184 Chronic kidney disease, stage 4 (severe): Secondary | ICD-10-CM | POA: Diagnosis present

## 2015-05-30 DIAGNOSIS — J45909 Unspecified asthma, uncomplicated: Secondary | ICD-10-CM | POA: Diagnosis present

## 2015-05-30 DIAGNOSIS — Z7982 Long term (current) use of aspirin: Secondary | ICD-10-CM | POA: Diagnosis not present

## 2015-05-30 DIAGNOSIS — Z79899 Other long term (current) drug therapy: Secondary | ICD-10-CM | POA: Diagnosis not present

## 2015-05-30 DIAGNOSIS — D649 Anemia, unspecified: Secondary | ICD-10-CM | POA: Diagnosis present

## 2015-05-30 DIAGNOSIS — R778 Other specified abnormalities of plasma proteins: Secondary | ICD-10-CM | POA: Insufficient documentation

## 2015-05-30 DIAGNOSIS — Z6841 Body Mass Index (BMI) 40.0 and over, adult: Secondary | ICD-10-CM | POA: Diagnosis not present

## 2015-05-30 LAB — CBC
HCT: 22 % — ABNORMAL LOW (ref 39.0–52.0)
HCT: 26.4 % — ABNORMAL LOW (ref 39.0–52.0)
HEMATOCRIT: 21.2 % — AB (ref 39.0–52.0)
HEMATOCRIT: 26.1 % — AB (ref 39.0–52.0)
HEMATOCRIT: 26.1 % — AB (ref 39.0–52.0)
HEMOGLOBIN: 7.5 g/dL — AB (ref 13.0–17.0)
HEMOGLOBIN: 8.8 g/dL — AB (ref 13.0–17.0)
HEMOGLOBIN: 8.9 g/dL — AB (ref 13.0–17.0)
Hemoglobin: 7.2 g/dL — ABNORMAL LOW (ref 13.0–17.0)
Hemoglobin: 8.5 g/dL — ABNORMAL LOW (ref 13.0–17.0)
MCH: 30.7 pg (ref 26.0–34.0)
MCH: 30.8 pg (ref 26.0–34.0)
MCH: 31.2 pg (ref 26.0–34.0)
MCH: 31.5 pg (ref 26.0–34.0)
MCH: 31.6 pg (ref 26.0–34.0)
MCHC: 32.6 g/dL (ref 30.0–36.0)
MCHC: 33.7 g/dL (ref 30.0–36.0)
MCHC: 33.7 g/dL (ref 30.0–36.0)
MCHC: 34 g/dL (ref 30.0–36.0)
MCHC: 34.1 g/dL (ref 30.0–36.0)
MCV: 91.3 fL (ref 78.0–100.0)
MCV: 91.8 fL (ref 78.0–100.0)
MCV: 92.4 fL (ref 78.0–100.0)
MCV: 93.6 fL (ref 78.0–100.0)
MCV: 94.2 fL (ref 78.0–100.0)
PLATELETS: 121 10*3/uL — AB (ref 150–400)
PLATELETS: 139 10*3/uL — AB (ref 150–400)
Platelets: 118 10*3/uL — ABNORMAL LOW (ref 150–400)
Platelets: 111 10*3/uL — ABNORMAL LOW (ref 150–400)
Platelets: 71 10*3/uL — ABNORMAL LOW (ref 150–400)
RBC: 2.31 MIL/uL — AB (ref 4.22–5.81)
RBC: 2.38 MIL/uL — AB (ref 4.22–5.81)
RBC: 2.77 MIL/uL — AB (ref 4.22–5.81)
RBC: 2.82 MIL/uL — AB (ref 4.22–5.81)
RBC: 2.86 MIL/uL — ABNORMAL LOW (ref 4.22–5.81)
RDW: 15.2 % (ref 11.5–15.5)
RDW: 15.3 % (ref 11.5–15.5)
RDW: 15.3 % (ref 11.5–15.5)
RDW: 15.4 % (ref 11.5–15.5)
RDW: 15.8 % — ABNORMAL HIGH (ref 11.5–15.5)
WBC: 14.1 10*3/uL — ABNORMAL HIGH (ref 4.0–10.5)
WBC: 17 10*3/uL — AB (ref 4.0–10.5)
WBC: 19.6 10*3/uL — AB (ref 4.0–10.5)
WBC: 24 10*3/uL — AB (ref 4.0–10.5)
WBC: 14.2 10*3/uL — AB (ref 4.0–10.5)

## 2015-05-30 LAB — BASIC METABOLIC PANEL
ANION GAP: 7 (ref 5–15)
ANION GAP: 11 (ref 5–15)
BUN: 74 mg/dL — ABNORMAL HIGH (ref 6–20)
BUN: 83 mg/dL — ABNORMAL HIGH (ref 6–20)
CALCIUM: 7.7 mg/dL — AB (ref 8.9–10.3)
CALCIUM: 8.1 mg/dL — AB (ref 8.9–10.3)
CO2: 17 mmol/L — ABNORMAL LOW (ref 22–32)
CO2: 16 mmol/L — AB (ref 22–32)
CREATININE: 1.79 mg/dL — AB (ref 0.61–1.24)
CREATININE: 1.81 mg/dL — AB (ref 0.61–1.24)
Chloride: 110 mmol/L (ref 101–111)
Chloride: 113 mmol/L — ABNORMAL HIGH (ref 101–111)
GFR calc non Af Amer: 38 mL/min — ABNORMAL LOW (ref >60)
GFR, EST AFRICAN AMERICAN: 45 mL/min — AB (ref >60)
GFR, EST AFRICAN AMERICAN: 44 mL/min — AB (ref >60)
GFR, EST NON AFRICAN AMERICAN: 39 mL/min — AB (ref >60)
GLUCOSE: 185 mg/dL — AB (ref 65–99)
Glucose, Bld: 179 mg/dL — ABNORMAL HIGH (ref 65–99)
Potassium: 4.4 mmol/L (ref 3.5–5.1)
Potassium: 4 mmol/L (ref 3.5–5.1)
SODIUM: 137 mmol/L (ref 135–145)
Sodium: 137 mmol/L (ref 135–145)

## 2015-05-30 LAB — GLUCOSE, CAPILLARY
GLUCOSE-CAPILLARY: 172 mg/dL — AB (ref 65–99)
GLUCOSE-CAPILLARY: 205 mg/dL — AB (ref 65–99)
Glucose-Capillary: 238 mg/dL — ABNORMAL HIGH (ref 65–99)
Glucose-Capillary: 309 mg/dL — ABNORMAL HIGH (ref 65–99)
Glucose-Capillary: 282 mg/dL — ABNORMAL HIGH (ref 65–99)

## 2015-05-30 LAB — BLOOD GAS, ARTERIAL
Acid-base deficit: 23.9 mmol/L — ABNORMAL HIGH (ref 0.0–2.0)
Acid-base deficit: 15.1 mmol/L — ABNORMAL HIGH (ref 0.0–2.0)
BICARBONATE: 6 meq/L — AB (ref 20.0–24.0)
Bicarbonate: 10.5 mEq/L — ABNORMAL LOW (ref 20.0–24.0)
DRAWN BY: 129711
DRAWN BY: 21310
FIO2: 100
FIO2: 80
MECHVT: 620 mL
O2 SAT: 97.5 %
O2 Saturation: 99.2 %
PATIENT TEMPERATURE: 37
PATIENT TEMPERATURE: 37
PCO2 ART: 31.6 mmHg — AB (ref 35.0–45.0)
PCO2 ART: 23.8 mmHg — AB (ref 35.0–45.0)
PEEP/CPAP: 5 cmH2O
PEEP/CPAP: 5 cmH2O
PH ART: 6.909 — AB (ref 7.350–7.450)
PH ART: 7.268 — AB (ref 7.350–7.450)
PO2 ART: 290 mmHg — AB (ref 80.0–100.0)
RATE: 20 resp/min
RATE: 30 resp/min
TCO2: 6.5 mmol/L (ref 0–100)
TCO2: 10.5 mmol/L (ref 0–100)
VT: 0.62 mL
pO2, Arterial: 222 mmHg — ABNORMAL HIGH (ref 80.0–100.0)

## 2015-05-30 LAB — HEMOGLOBIN AND HEMATOCRIT, BLOOD
HCT: 23.3 % — ABNORMAL LOW (ref 39.0–52.0)
HCT: 25.8 % — ABNORMAL LOW (ref 39.0–52.0)
HCT: 17.3 % — ABNORMAL LOW (ref 39.0–52.0)
Hemoglobin: 7.9 g/dL — ABNORMAL LOW (ref 13.0–17.0)
Hemoglobin: 8.5 g/dL — ABNORMAL LOW (ref 13.0–17.0)
Hemoglobin: 5.9 g/dL — CL (ref 13.0–17.0)

## 2015-05-30 LAB — PREPARE RBC (CROSSMATCH)

## 2015-05-30 LAB — LACTIC ACID, PLASMA: LACTIC ACID, VENOUS: 13.9 mmol/L — AB (ref 0.5–2.0)

## 2015-05-30 LAB — URINALYSIS, ROUTINE W REFLEX MICROSCOPIC
Bilirubin Urine: NEGATIVE
Glucose, UA: NEGATIVE mg/dL
Hgb urine dipstick: NEGATIVE
KETONES UR: NEGATIVE mg/dL
LEUKOCYTES UA: NEGATIVE
NITRITE: NEGATIVE
PROTEIN: NEGATIVE mg/dL
Specific Gravity, Urine: 1.02 (ref 1.005–1.030)
UROBILINOGEN UA: 0.2 mg/dL (ref 0.0–1.0)
pH: 5.5 (ref 5.0–8.0)

## 2015-05-30 LAB — TROPONIN I
TROPONIN I: 0.05 ng/mL — AB (ref <0.031)
TROPONIN I: 0.05 ng/mL — AB (ref <0.031)
TROPONIN I: 0.06 ng/mL — AB (ref <0.031)
TROPONIN I: 0.06 ng/mL — AB (ref <0.031)
Troponin I: 0.05 ng/mL — ABNORMAL HIGH (ref <0.031)
Troponin I: 0.15 ng/mL — ABNORMAL HIGH (ref <0.031)

## 2015-05-30 LAB — COMPREHENSIVE METABOLIC PANEL
ALK PHOS: 26 U/L — AB (ref 38–126)
ALT: 24 U/L (ref 17–63)
AST: 14 U/L — AB (ref 15–41)
Albumin: 2.1 g/dL — ABNORMAL LOW (ref 3.5–5.0)
Anion gap: 8 (ref 5–15)
BILIRUBIN TOTAL: 0.4 mg/dL (ref 0.3–1.2)
BUN: 96 mg/dL — AB (ref 6–20)
CO2: 17 mmol/L — ABNORMAL LOW (ref 22–32)
CREATININE: 2.1 mg/dL — AB (ref 0.61–1.24)
Calcium: 7.4 mg/dL — ABNORMAL LOW (ref 8.9–10.3)
Chloride: 112 mmol/L — ABNORMAL HIGH (ref 101–111)
GFR calc Af Amer: 37 mL/min — ABNORMAL LOW (ref >60)
GFR, EST NON AFRICAN AMERICAN: 32 mL/min — AB (ref >60)
Glucose, Bld: 230 mg/dL — ABNORMAL HIGH (ref 65–99)
Potassium: 4.5 mmol/L (ref 3.5–5.1)
Sodium: 137 mmol/L (ref 135–145)
TOTAL PROTEIN: 3.7 g/dL — AB (ref 6.5–8.1)

## 2015-05-30 LAB — OCCULT BLOOD GASTRIC / DUODENUM (SPECIMEN CUP)
OCCULT BLOOD, GASTRIC: POSITIVE — AB
pH, Gastric: 3

## 2015-05-30 LAB — BRAIN NATRIURETIC PEPTIDE: B NATRIURETIC PEPTIDE 5: 82 pg/mL (ref 0.0–100.0)

## 2015-05-30 LAB — I-STAT CG4 LACTIC ACID, ED: Lactic Acid, Venous: 2.09 mmol/L (ref 0.5–2.0)

## 2015-05-30 LAB — PROTIME-INR
INR: 1.84 — AB (ref 0.00–1.49)
Prothrombin Time: 21.2 seconds — ABNORMAL HIGH (ref 11.6–15.2)

## 2015-05-30 LAB — POC OCCULT BLOOD, ED: Fecal Occult Bld: POSITIVE — AB

## 2015-05-30 LAB — D-DIMER, QUANTITATIVE: D-Dimer, Quant: 0.41 ug/mL-FEU (ref 0.00–0.48)

## 2015-05-30 LAB — ABO/RH: ABO/RH(D): O POS

## 2015-05-30 LAB — MRSA PCR SCREENING: MRSA BY PCR: NEGATIVE

## 2015-05-30 LAB — CBG MONITORING, ED: GLUCOSE-CAPILLARY: 175 mg/dL — AB (ref 65–99)

## 2015-05-30 MED ORDER — SODIUM CHLORIDE 0.9 % IV SOLN
Freq: Once | INTRAVENOUS | Status: AC
  Administered 2015-05-30: 13:00:00 via INTRAVENOUS

## 2015-05-30 MED ORDER — SODIUM CHLORIDE 0.9 % IV BOLUS (SEPSIS)
1000.0000 mL | Freq: Once | INTRAVENOUS | Status: AC
  Administered 2015-05-30: 1000 mL via INTRAVENOUS

## 2015-05-30 MED ORDER — SODIUM CHLORIDE 0.9 % IV SOLN: Freq: Once | INTRAVENOUS | Status: AC

## 2015-05-30 MED ORDER — SODIUM CHLORIDE 0.9 % IV SOLN
Freq: Once | INTRAVENOUS | Status: AC
  Administered 2015-05-30: 21:00:00 via INTRAVENOUS

## 2015-05-30 MED ORDER — SODIUM CHLORIDE 0.9 % IV BOLUS (SEPSIS)
500.0000 mL | INTRAVENOUS | Status: AC
  Administered 2015-05-30: 500 mL via INTRAVENOUS

## 2015-05-30 MED ORDER — PANTOPRAZOLE SODIUM 40 MG IV SOLR
INTRAVENOUS | Status: AC
  Filled 2015-05-30: qty 160

## 2015-05-30 MED ORDER — NOREPINEPHRINE BITARTRATE 1 MG/ML IV SOLN
2.0000 ug/min | INTRAVENOUS | Status: DC
  Administered 2015-05-30: 5 ug/min via INTRAVENOUS
  Administered 2015-05-31: 15 ug/min via INTRAVENOUS
  Filled 2015-05-30: qty 4

## 2015-05-30 MED ORDER — INSULIN GLARGINE 100 UNIT/ML ~~LOC~~ SOLN
28.0000 [IU] | Freq: Every day | SUBCUTANEOUS | Status: DC
  Administered 2015-05-30: 28 [IU] via SUBCUTANEOUS
  Filled 2015-05-30 (×2): qty 0.28

## 2015-05-30 MED ORDER — ALUM & MAG HYDROXIDE-SIMETH 200-200-20 MG/5ML PO SUSP: 30.0000 mL | Freq: Four times a day (QID) | ORAL | Status: DC | PRN

## 2015-05-30 MED ORDER — NOREPINEPHRINE BITARTRATE 1 MG/ML IV SOLN
INTRAVENOUS | Status: AC
  Filled 2015-05-30: qty 4

## 2015-05-30 MED ORDER — INSULIN GLARGINE 100 UNITS/ML SOLOSTAR PEN: 20.0000 [IU] | PEN_INJECTOR | Freq: Two times a day (BID) | SUBCUTANEOUS | Status: DC

## 2015-05-30 MED ORDER — SODIUM CHLORIDE 0.9 % IV SOLN
8.0000 mg/h | INTRAVENOUS | Status: DC
  Administered 2015-05-30: 8 mg/h via INTRAVENOUS
  Filled 2015-05-30 (×7): qty 80

## 2015-05-30 MED ORDER — ROCURONIUM BROMIDE 50 MG/5ML IV SOLN
INTRAVENOUS | Status: AC
  Filled 2015-05-30: qty 2

## 2015-05-30 MED ORDER — ONDANSETRON HCL 4 MG/2ML IJ SOLN
4.0000 mg | Freq: Four times a day (QID) | INTRAMUSCULAR | Status: DC | PRN
Start: 2015-05-30 — End: 2015-05-31

## 2015-05-30 MED ORDER — LIDOCAINE HCL (CARDIAC) 20 MG/ML IV SOLN
INTRAVENOUS | Status: AC
  Filled 2015-05-30: qty 5

## 2015-05-30 MED ORDER — SODIUM CHLORIDE 0.9 % IV SOLN
INTRAVENOUS | Status: DC
  Administered 2015-05-30: 20:00:00 via INTRAVENOUS

## 2015-05-30 MED ORDER — SODIUM BICARBONATE 8.4 % IV SOLN
100.0000 meq | Freq: Once | INTRAVENOUS | Status: AC
  Administered 2015-05-30: 100 meq via INTRAVENOUS

## 2015-05-30 MED ORDER — DILTIAZEM LOAD VIA INFUSION
10.0000 mg | Freq: Once | INTRAVENOUS | Status: AC
  Administered 2015-05-30: 10 mg via INTRAVENOUS
  Filled 2015-05-30: qty 10

## 2015-05-30 MED ORDER — SUCCINYLCHOLINE CHLORIDE 20 MG/ML IJ SOLN
INTRAMUSCULAR | Status: AC
  Administered 2015-05-30: 100 mg
  Filled 2015-05-30: qty 1

## 2015-05-30 MED ORDER — ACETAMINOPHEN 325 MG PO TABS: 650.0000 mg | ORAL_TABLET | Freq: Four times a day (QID) | ORAL | Status: DC | PRN

## 2015-05-30 MED ORDER — INSULIN GLARGINE 100 UNITS/ML SOLOSTAR PEN
28.0000 [IU] | PEN_INJECTOR | Freq: Every day | SUBCUTANEOUS | Status: DC
  Filled 2015-05-30: qty 3

## 2015-05-30 MED ORDER — INSULIN ASPART 100 UNIT/ML ~~LOC~~ SOLN
0.0000 [IU] | SUBCUTANEOUS | Status: DC
Start: 2015-05-30 — End: 2015-05-31
  Administered 2015-05-30: 15 [IU] via SUBCUTANEOUS
  Administered 2015-05-31: 4 [IU] via SUBCUTANEOUS

## 2015-05-30 MED ORDER — ACETAMINOPHEN 325 MG PO TABS
650.0000 mg | ORAL_TABLET | Freq: Once | ORAL | Status: AC
  Administered 2015-05-30: 650 mg via ORAL
  Filled 2015-05-30: qty 2

## 2015-05-30 MED ORDER — OXYCODONE HCL 5 MG PO TABS: 5.0000 mg | ORAL_TABLET | ORAL | Status: DC | PRN

## 2015-05-30 MED ORDER — SODIUM CHLORIDE 0.9 % IJ SOLN: 10.0000 mL | INTRAMUSCULAR | Status: DC | PRN

## 2015-05-30 MED ORDER — SODIUM BICARBONATE 8.4 % IV SOLN
200.0000 meq | Freq: Once | INTRAVENOUS | Status: AC
  Administered 2015-05-30: 200 meq via INTRAVENOUS
  Filled 2015-05-30: qty 200

## 2015-05-30 MED ORDER — SODIUM CHLORIDE 0.9 % IV SOLN
80.0000 mg | Freq: Once | INTRAVENOUS | Status: AC
  Administered 2015-05-30: 80 mg via INTRAVENOUS
  Filled 2015-05-30: qty 80

## 2015-05-30 MED ORDER — ONDANSETRON HCL 4 MG PO TABS: 4.0000 mg | ORAL_TABLET | Freq: Four times a day (QID) | ORAL | Status: DC | PRN

## 2015-05-30 MED ORDER — FUROSEMIDE 10 MG/ML IJ SOLN
20.0000 mg | Freq: Once | INTRAMUSCULAR | Status: AC
  Administered 2015-05-30: 20 mg via INTRAVENOUS
  Filled 2015-05-30: qty 2

## 2015-05-30 MED ORDER — ATORVASTATIN CALCIUM 20 MG PO TABS: 20.0000 mg | ORAL_TABLET | Freq: Every day | ORAL | Status: DC

## 2015-05-30 MED ORDER — DILTIAZEM HCL 60 MG PO TABS: 90.0000 mg | ORAL_TABLET | Freq: Three times a day (TID) | ORAL | Status: DC

## 2015-05-30 MED ORDER — SODIUM BICARBONATE 8.4 % IV SOLN
100.0000 meq | Freq: Once | INTRAVENOUS | Status: AC
Start: 2015-05-30 — End: 2015-05-30
  Administered 2015-05-30: 100 meq via INTRAVENOUS
  Filled 2015-05-30: qty 100

## 2015-05-30 MED ORDER — SODIUM CHLORIDE 0.9 % IV SOLN
0.5000 mg/h | INTRAVENOUS | Status: DC
  Filled 2015-05-30: qty 10

## 2015-05-30 MED ORDER — METOPROLOL SUCCINATE ER 25 MG PO TB24
25.0000 mg | ORAL_TABLET | Freq: Two times a day (BID) | ORAL | Status: DC
  Administered 2015-05-30: 25 mg via ORAL
  Filled 2015-05-30: qty 1

## 2015-05-30 MED ORDER — SODIUM CHLORIDE 0.9 % IV BOLUS (SEPSIS)
500.0000 mL | Freq: Once | INTRAVENOUS | Status: AC
  Administered 2015-05-30: 500 mL via INTRAVENOUS

## 2015-05-30 MED ORDER — HYDROMORPHONE HCL 1 MG/ML IJ SOLN: 0.5000 mg | INTRAMUSCULAR | Status: DC | PRN

## 2015-05-30 MED ORDER — SODIUM BICARBONATE 8.4 % IV SOLN
INTRAVENOUS | Status: AC
  Filled 2015-05-30: qty 100

## 2015-05-30 MED ORDER — ACETAMINOPHEN 650 MG RE SUPP: 650.0000 mg | Freq: Four times a day (QID) | RECTAL | Status: DC | PRN

## 2015-05-30 MED ORDER — PANTOPRAZOLE SODIUM 40 MG IV SOLR: 40.0000 mg | Freq: Two times a day (BID) | INTRAVENOUS | Status: DC

## 2015-05-30 MED ORDER — INSULIN GLARGINE 100 UNIT/ML ~~LOC~~ SOLN
20.0000 [IU] | Freq: Every day | SUBCUTANEOUS | Status: DC
Start: 2015-05-30 — End: 2015-05-31
  Administered 2015-05-30: 20 [IU] via SUBCUTANEOUS
  Filled 2015-05-30: qty 0.2

## 2015-05-30 MED ORDER — SODIUM CHLORIDE 0.9 % IV SOLN
Freq: Once | INTRAVENOUS | Status: AC
  Administered 2015-05-30: 15:00:00 via INTRAVENOUS

## 2015-05-30 MED ORDER — DILTIAZEM HCL 100 MG IV SOLR
5.0000 mg/h | INTRAVENOUS | Status: DC
  Administered 2015-05-30: 10 mg/h via INTRAVENOUS
  Administered 2015-05-30: 5 mg/h via INTRAVENOUS
  Filled 2015-05-30 (×2): qty 100

## 2015-05-30 MED ORDER — SODIUM CHLORIDE 0.9 % IJ SOLN
10.0000 mL | Freq: Two times a day (BID) | INTRAMUSCULAR | Status: DC
  Administered 2015-05-30: 10 mL

## 2015-05-30 MED ORDER — ETOMIDATE 2 MG/ML IV SOLN
INTRAVENOUS | Status: AC
  Administered 2015-05-30: 20 mg
  Filled 2015-05-30: qty 20

## 2015-05-30 MED ORDER — VITAMIN K1 10 MG/ML IJ SOLN
5.0000 mg | Freq: Once | INTRAMUSCULAR | Status: AC
  Administered 2015-05-30: 5 mg via SUBCUTANEOUS
  Filled 2015-05-30: qty 1

## 2015-05-30 MED ORDER — SODIUM CHLORIDE 0.9 % IV SOLN: Freq: Once | INTRAVENOUS | Status: DC

## 2015-05-30 NOTE — Progress Notes (Signed)
Patient's condition reviewed with her sister Mrs. Senaida Ores. EGD delayed because of hemodynamic instability. I just talked with patient about 15 minutes ago and he was more alert and able to answer questions. I was called by nursing staff to let you know that he had syncopal episode and respiratory difficulty and is in the process of being intubated. Once patient stabilizes he will also need NG tube and if he has evidence of active bleed will plan EGD later this evening.

## 2015-05-30 NOTE — Progress Notes (Signed)
eLink Physician-Brief Progress Note Patient Name: CEPHUS TUPY DOB: Mar 23, 1951 MRN: [id number]   Date of Service  05/30/2015  HPI/Events of Note  ABG on 100%/PRVC 20/TV 8 mL/kg/P 5 = 6.90/32/222/6  eICU Interventions  Will order: 1. NaHCO3 200 meq IV now.  2. Increase rate to 30. 3. Repeat ABG at 8:45 PM.      Intervention Category Major Interventions: Respiratory failure - evaluation and management;Acid-Base disturbance - evaluation and management  Lenell Antu 05/30/2015, 7:39 PM

## 2015-05-30 NOTE — Progress Notes (Signed)
Patient had peripheral IV access x2.  One IV was leaking.  Patient ordered multiple IV medications that are incompatible to run together.  MD notified and ordered to insert PICC line for poor venous access.

## 2015-05-30 NOTE — Progress Notes (Signed)
Peripherally Inserted Central Catheter/Midline Placement  The IV Nurse has discussed with the patient and/or persons authorized to consent for the patient, the purpose of this procedure and the potential benefits and risks involved with this procedure.  The benefits include less needle sticks, lab draws from the catheter and patient may be discharged home with the catheter.  Risks include, but not limited to, infection, bleeding, blood clot (thrombus formation), and puncture of an artery; nerve damage and irregular heat beat.  Alternatives to this procedure were also discussed.  PICC/Midline Placement Documentation  PICC Triple Lumen 05/30/15 PICC Right Basilic 43 cm 1 cm (Active)  Indication for Insertion or Continuance of Line Limited venous access - need for IV therapy >5 days (PICC only) 05/30/2015 11:34 AM  Exposed Catheter (cm) 1 cm 05/30/2015 11:34 AM  Site Assessment Clean;Dry;Intact 05/30/2015 11:34 AM  Lumen #1 Status Blood return noted;Flushed;Capped (Central line) 05/30/2015 11:34 AM  Lumen #2 Status Flushed;Capped (Central line);Blood return noted 05/30/2015 11:34 AM  Lumen #3 Status Flushed;Capped (Central line);Blood return noted 05/30/2015 11:34 AM  Dressing Type Transparent;Securing device 05/30/2015 11:34 AM  Dressing Status Clean;Dry;Antimicrobial disc in place;Intact 05/30/2015 11:34 AM  Line Care Connections checked and tightened 05/30/2015 11:34 AM  Line Adjustment (NICU/IV Team Only) No 05/30/2015 11:34 AM  Dressing Intervention New dressing 05/30/2015 11:34 AM  Dressing Change Due 06/06/15 05/30/2015 11:34 AM       Sharaine Delange, Meredith Mody 05/30/2015, 11:38 AM

## 2015-05-30 NOTE — ED Notes (Signed)
AC called for Protonix drip.

## 2015-05-30 NOTE — H&P (Signed)
[facility] Admission History and Physical       [name]:[id number] DOB: 1951/10/11 DOA: 05/19/2015  Referring physician: EDP PCP: Alice Reichert, MD  Specialists:   Chief Complaint: SOB and Dizziness  HPI: Vincent Kaufman is a 64 y.o. male with a history of Asthma, Aortic Stenosis S/P AVR on Eliquis Rx, Atiral Flutter, HTN, DM2, and CKD who presents to the ED with complaints of SOB and Lightheadedness this evening and he reports passing out.    He reports that he has been passing black stool for the past 3 weeks.   He denies any chest pain or ABD Pain. He denies having any hematemesis or hematochezia.     Review of Systems:  Constitutional: No Weight Loss, No Weight Gain, Night Sweats, Fevers, Chills, Dizziness, +Light Headedness, Fatigue, or Generalized Weakness HEENT: No Headaches, Difficulty Swallowing,Tooth/Dental Problems,Sore Throat,  No Sneezing, Rhinitis, Ear Ache, Nasal Congestion, or Post Nasal Drip,  Cardio-vascular:  No Chest pain, Orthopnea, PND, Edema in Lower Extremities, Anasarca, Dizziness, Palpitations  Resp: +Dyspnea, No DOE, No Productive Cough, No Non-Productive Cough, No Hemoptysis, No Wheezing.    GI: No Heartburn, Indigestion, Abdominal Pain, Nausea, Vomiting, Diarrhea, Constipation, Hematemesis, Hematochezia, +Melena, Change in Bowel Habits,  Loss of Appetite  GU: No Dysuria, No Change in Color of Urine, No Urgency or Urinary Frequency, No Flank pain.  Musculoskeletal: No Joint Pain or Swelling, No Decreased Range of Motion, No Back Pain.  Neurologic: No Syncope, No Seizures, Muscle Weakness, Paresthesia, Vision Disturbance or Loss, No Diplopia, No Vertigo, No Difficulty Walking,  Skin: No Rash or Lesions. Psych: No Change in Mood or Affect, No Depression or Anxiety, No Memory loss, No Confusion, or Hallucinations   Past Medical History  Diagnosis Date  . Diabetes mellitus without complication   . Hypertension   . Asthma   . CKD  (chronic kidney disease)   . AAA (abdominal aortic aneurysm) 2006    Repair  . Aortic valve stenosis 2006     Past Surgical History  Procedure Laterality Date  . Abdominal aortic aneurysm repair  2006    [facility]  . Aortic valve replacement  2006    [name]      Prior to Admission medications   Medication Sig Start Date End Date Taking? Authorizing Provider  apixaban (ELIQUIS) 5 MG TABS tablet Take 1 tablet (5 mg total) by mouth 2 (two) times daily. 05/14/15   Butch Penny, MD  aspirin EC 81 MG tablet Take 81 mg by mouth daily.    Historical Provider, MD  atorvastatin (LIPITOR) 20 MG tablet Take 1 tablet (20 mg total) by mouth daily at 6 PM. 05/14/15   Angus McInnis, MD  budesonide-formoterol (SYMBICORT) 160-4.5 MCG/ACT inhaler Inhale 2 puffs into the lungs 2 (two) times daily. 05/14/15   Butch Penny, MD  diltiazem (CARDIZEM) 90 MG tablet Take 1 tablet (90 mg total) by mouth 3 (three) times daily. 05/14/15   Butch Penny, MD  HYDROcodone-acetaminophen (NORCO) 7.5-325 MG per tablet Take 1 tablet by mouth every 4 (four) hours as needed for moderate pain or severe pain.  12/10/14   Historical Provider, MD  insulin aspart (NOVOLOG) 100 UNIT/ML FlexPen Inject 20 Units into the skin 3 (three) times daily with meals. Sliding scale    Historical Provider, MD  insulin glargine (LANTUS) 100 unit/mL SOPN Inject 20-28 Units into the skin 2 (two) times daily. 28 units in the morning and 20 units before bed    Historical Provider,  MD  levofloxacin (LEVAQUIN) 750 MG tablet Take 1 tablet (750 mg total) by mouth daily. 05/14/15   Angus McInnis, MD  lisinopril (PRINIVIL,ZESTRIL) 10 MG tablet Take 10 mg by mouth daily.    Historical Provider, MD  metoprolol succinate (TOPROL XL) 25 MG 24 hr tablet Take 1 tablet (25 mg total) by mouth 2 (two) times daily. 05/27/15   Laqueta Linden, MD  oxyCODONE-acetaminophen (PERCOCET/ROXICET) 5-325 MG per tablet Take 1 tablet by mouth every 6 (six) hours as  needed. 12/18/14   Bethann Berkshire, MD  predniSONE (DELTASONE) 20 MG tablet Take 2 tablets (40 mg total) by mouth daily with breakfast. 05/14/15   Butch Penny, MD  simvastatin (ZOCOR) 40 MG tablet Take 40 mg by mouth daily. 11/15/14   Historical Provider, MD     Allergies  Allergen Reactions  . Penicillins Rash    Social History:  reports that he quit smoking about 10 years ago. He does not have any smokeless tobacco history on file. He reports that he does not drink alcohol or use illicit drugs.    Family History  Problem Relation Age of Onset  . Rheum arthritis Mother        Physical Exam:  GEN:  Pleasant Morbidly Obese  64 y.o. Caucasian male examined and in no acute distress; cooperative with exam Filed Vitals:   05/30/15 0000 05/30/15 0030 05/30/15 0100 05/30/15 0130  BP: 102/89 105/74 103/75 100/60  Pulse: 134 132 134 137  Temp:      TempSrc:      Resp: 16 21 19 16   Height:      Weight:      SpO2: 100% 100% 100% 100%   Blood pressure 100/60, pulse 137, temperature 97.7 F (36.5 C), temperature source Oral, resp. rate 16, height 6\' 1"  (1.854 m), weight 139.708 kg (308 lb), SpO2 100 %. PSYCH: He is alert and oriented x4; does not appear anxious does not appear depressed; affect is normal HEENT: Normocephalic and Atraumatic, Mucous membranes pink; PERRLA; EOM intact; Fundi:  Benign;  No scleral icterus, Nares: Patent, Oropharynx: Clear,  Fair Dentition,    Neck:  FROM, No Cervical Lymphadenopathy nor Thyromegaly or Carotid Bruit; No JVD; Breasts:: Not examined CHEST WALL: No tenderness CHEST: Normal respiration, clear to auscultation bilaterally HEART: Regular rate and rhythm; no murmurs rubs or gallops BACK: No kyphosis or scoliosis; No CVA tenderness ABDOMEN: Positive Bowel Sounds, Obese, Soft Non-Tender, No Rebound or Guarding; No Masses, No Organomegaly, No Pannus; No Intertriginous candida. Rectal Exam: Not done EXTREMITIES: No Cyanosis, Clubbing, or Edema; No  Ulcerations. Genitalia: not examined PULSES: 2+ and symmetric SKIN: Normal hydration no rash or ulceration CNS:  Alert and Oriented x 4, No Focal Deficits Vascular: pulses palpable throughout    Labs on Admission:  Basic Metabolic Panel:  Recent Labs Lab 05/21/2015 2355  NA 137  K 4.4  CL 110  CO2 16*  GLUCOSE 179*  BUN 74*  CREATININE 1.81*  CALCIUM 8.1*   Liver Function Tests: No results for input(s): AST, ALT, ALKPHOS, BILITOT, PROT, ALBUMIN in the last 168 hours. No results for input(s): LIPASE, AMYLASE in the last 168 hours. No results for input(s): AMMONIA in the last 168 hours. CBC:  Recent Labs Lab 05/27/2015 2355  WBC 14.2*  HGB 8.9*  HCT 26.4*  MCV 93.6  PLT 139*   Cardiac Enzymes:  Recent Labs Lab 06/15/2015 2350  TROPONINI 0.05*    BNP (last 3 results)  Recent Labs  05/03/15 1203  05/09/15 0908 2015/06/20 2350  BNP 220.0* 81.0 82.0    ProBNP (last 3 results) No results for input(s): PROBNP in the last 8760 hours.  CBG:  Recent Labs Lab 05/30/15 0018  GLUCAP 175*    Radiological Exams on Admission: Dg Chest Port 1 View  05/30/2015   CLINICAL DATA:  Shortness of breath  EXAM: PORTABLE CHEST - 1 VIEW  COMPARISON:  05/09/2015  FINDINGS: Stable cardiomegaly and aortic tortuosity in this patient post aortic valve replacement. There is no edema, consolidation, effusion, or pneumothorax. No osseous findings to explain chest pain.  IMPRESSION: Stable exam.  No evidence of acute cardiopulmonary disease.   Electronically Signed   By: Marnee Spring M.D.   On: 05/30/2015 01:01     EKG: Independently reviewed.    Assessment/Plan:   64 y.o. male with  Principal Problem:   1.    Upper gastrointestinal bleeding   Stepdown Unit Monitoring   Hold Eliquis Rx, may need to start IV Heparin due to AVR   IV Protonix Drip   Monitor H/Hs   Transfuse PRN   GI Consult in AM       Active Problems:   2.    Anemia   Monitor H/Hs   Transfuse PRN     3.    Atrial flutter   Continue Diltiazem and Metoprolol Rx    As BP tolerates    Hold Eliquis     4.    Diabetes mellitus without complication   Continue Lantus   SSI coverage PRN   Check HbA1C     5.    CKD (chronic kidney disease)   Monitor BUN/Cr     6.    Asthma   Duoneb Rx PRN     7.    DVT Prophylaxis   SCDs          Code Status:     FULL CODE   Family Communication:   No Family Present    Disposition Plan:    Observation Status        Time spent:  13 Minutes      Ron Parker [facility] Pager [phone]   If 7AM -7PM Please Contact the Day Rounding Team MD for [facility]  If 7PM-7AM, Please Contact Night-Floor Coverage  [email or web address] Password [email or web address] 05/30/2015, 1:52 AM     ADDENDUM:   Patient was seen and examined on 05/30/2015

## 2015-05-30 NOTE — Progress Notes (Signed)
eLink Physician-Brief Progress Note Patient Name: Vincent Kaufman DOB: 1950/12/09 MRN: [id number]   Date of Service  05/30/2015  HPI/Events of Note  ABG = 7.26/23.8/290/10.5.   eICU Interventions  Will give another 200 meq NaHCO3 IV now.     Intervention Category Major Interventions: Acid-Base disturbance - evaluation and management;Respiratory failure - evaluation and management  Lenell Antu 05/30/2015, 9:17 PM

## 2015-05-30 NOTE — Progress Notes (Addendum)
RT note- called to code blue in ICU. Ventilations being assisted upon time of arrival, E-link on in room for assistance and ED physician arrived. Patient was intubated for agonal respirations and respiratory insuffiencey , nasal trumpet placed, due to restlessness and thrashing prior to intubation then removed post. Patient was then placed ventilator per protocol and orders.

## 2015-05-30 NOTE — Progress Notes (Signed)
MD notified of patient's repeat H/H s/p blood transfusion this am.  New orders received and carried out to transfuse 1 additional unit PRBCs.

## 2015-05-30 NOTE — Progress Notes (Signed)
eLink Physician-Brief Progress Note Patient Name: Vincent Kaufman DOB: 1951/03/09 MRN: [id number]   Date of Service  05/30/2015  HPI/Events of Note  Agonal respirations. Patient intubated by ED physician.   eICU Interventions  Will order: 1. Ventilator Settings: 100%/PRVC 20/TV 8 mL/kg/P 5.  2. Portable CXR now.  3. ABG in 1 hour.  4. Sedation with Versed IV. Titrate to RASS = 0 to -1.  5. 0.9 NaCl 1 liter IV over 1 hour now.      Intervention Category Major Interventions: Respiratory failure - evaluation and management  Lenell Antu 05/30/2015, 5:47 PM

## 2015-05-30 NOTE — Care Management Note (Signed)
Case Management Note  Patient Details  Name: Vincent Kaufman MRN: [id number] Date of Birth: 1951-05-25  Expected Discharge Date:                  Expected Discharge Plan:  Home/Self Care  In-House Referral:  NA  Discharge planning Services  CM Consult  Post Acute Care Choice:  NA Choice offered to:  NA  DME Arranged:    DME Agency:     HH Arranged:    HH Agency:     Status of Service:  In process, will continue to follow  Medicare Important Message Given:    Date Medicare IM Given:    Medicare IM give by:    Date Additional Medicare IM Given:    Additional Medicare Important Message give by:     If discussed at Long Length of Stay Meetings, dates discussed:    Additional Comments: Pt is from home and independent at baseline. Pt has home O2 and neb machine. Pt plans to return home with self care at DC. No CM needs anticipated.  Malcolm Metro, RN 05/30/2015, 3:26 PM

## 2015-05-30 NOTE — Clinical Documentation Improvement (Signed)
For accurate DX specificify & severity can noted "CKD" be further specified w/ stage. Thank you  05/30/15 progr note..."4. CK D continue to monitor..." TX: Daily BMP,   sodium chloride 75 mL/hr at 05/30/15 0330        Ref. Range 06/10/2015 23:55 05/30/2015 03:45  BUN Latest Ref Range: 6-20 mg/dL 74 (H) 83 (H)  Creatinine Latest Ref Range: 0.61-1.24 mg/dL 1.81 (H) 1.79 (H)  EGFR (Non-African Amer.) Latest Ref Range: >60 mL/min 38 (L) 39 (L)   . Document the stage of CKD --Chronic kidney disease, stage 1- GFR > OR = 90 --Chronic kidney disease, stage 2 (mild) - GFR 60-89 --Chronic kidney disease, stage 3 (moderate) - GFR 30-59 --Chronic kidney disease, stage 4 (severe) - GFR 15-29 --Chronic kidney disease, stage 5- GFR < 15 --End-stage renal disease (ESRD) . Document any underlying cause of CKD such as Diabetes or Hypertension . Document if the patient is dependent on Dialysis . Chronic renal failure without a documented stage will be assigned to Chronic kidney disease, unspecified . Document any associated diagnoses/conditions  Supporting Information:  Thank You,  Ermelinda Das, RN, BSN, CCDS Certified Clinical Documentation Specialist Pager: [place]: Health Information Management

## 2015-05-30 NOTE — Progress Notes (Signed)
Vincent Kaufman:[id number] DOB: 26-May-1951 DOA: 05/30/2015 PCP: Alice Reichert, MD   Subjective: I was asked to come to the intensive care unit because the patient apparently had a sudden collapse. He was admitted with what appears to be a GI bleed and hypotension. He had a sudden onset of loss of consciousness and difficulty with breathing. He has become unconscious. CODE BLUE was called although he never lost his pulse. His blood pressure is acceptable. He has now been intubated and will be put on a mechanical ventilator.           Physical Exam: Blood pressure 79/27, pulse 123, temperature 97.8 F (36.6 C), temperature source Oral, resp. rate 29, height  (1.854 m), weight 141.6 kg (312 lb 2.7 oz), SpO2 100 %. He is clammy and relatively hypotensive. Apparently both lung fields appropriate and clear. He remains unconscious.   Investigations:  Recent Results (from the past 240 hour(s))  MRSA PCR Screening     Status: None   Collection Time: 05/30/15  3:59 AM  Result Value Ref Range Status   MRSA by PCR NEGATIVE NEGATIVE Final     Basic Metabolic Panel:  Recent Labs  69/62/95 2355 05/30/15 0345  NA 137 137  K 4.4 4.0  CL 110 113*  CO2 16* 17*  GLUCOSE 179* 185*  BUN 74* 83*  CREATININE 1.81* 1.79*  CALCIUM 8.1* 7.7*   Liver Function Tests: No results for input(s): AST, ALT, ALKPHOS, BILITOT, PROT, ALBUMIN in the last 72 hours.   CBC:  Recent Labs  05/30/15 0345 05/30/15 0857  WBC 14.1* 17.0*  HGB 7.5* 7.2*  HCT 22.0* 21.2*  MCV 92.4 91.8  PLT 121* 118*    Dg Chest Port 1 View  05/30/2015   CLINICAL DATA:  Central catheter placement  EXAM: PORTABLE CHEST - 1 VIEW  COMPARISON:  Study obtained earlier in the day  FINDINGS: A portion of the lateral left base is not visualized on this study. Central catheter tip is in the superior vena cava. No pneumothorax is appreciable. The visualized lungs are clear. The heart is upper normal in size with  pulmonary vascularity within normal limits. Patient is status post aortic valve replacement. No adenopathy.  IMPRESSION: Note that a portion of the left base is not visualized. Visualize lungs clear. Central catheter tip in superior vena cava. No pneumothorax.   Electronically Signed   By: Bretta Bang III M.D.   On: 05/30/2015 12:01   Dg Chest Port 1 View  05/30/2015   CLINICAL DATA:  Shortness of breath  EXAM: PORTABLE CHEST - 1 VIEW  COMPARISON:  05/09/2015  FINDINGS: Stable cardiomegaly and aortic tortuosity in this patient post aortic valve replacement. There is no edema, consolidation, effusion, or pneumothorax. No osseous findings to explain chest pain.  IMPRESSION: Stable exam.  No evidence of acute cardiopulmonary disease.   Electronically Signed   By: Marnee Spring M.D.   On: 05/30/2015 01:01      Medications: I have reviewed the patient's current medications.  Impression: 1. Acute loss of consciousness and respiratory failure with failure to protect airway. The patient has been intubated and mechanically is going to be ventilated.     Plan: 1. CBC, complete metabolic panel serial cardiac enzymes. Arterial blood gas. D-dimer. 2. Chest x-ray.                       Code Status: Full code.  Family Communication: I discussed the plan  with the patient's brother who was visiting.     Time spent: 20 minutes   LOS: 0 days   Helina Hullum C   05/30/2015, 5:41 PM

## 2015-05-30 NOTE — Progress Notes (Signed)
eLink Physician-Brief Progress Note Patient Name: Vincent Kaufman DOB: 05-Jun-1951 MRN: [id number]   Date of Service  05/30/2015  HPI/Events of Note  Hypotension. BP = 69/45.  eICU Interventions  Will order: 1. 0.9 NaCl 1 liter IV over 1 hour now. 2. NaHCO3 100 meq IV now.      Intervention Category Intermediate Interventions: Hypotension - evaluation and management  Shermaine Brigham Eugene 05/30/2015, 8:05 PM

## 2015-05-30 NOTE — Consult Note (Signed)
Referring Provider: Alice Reichert, MD Primary Care Physician:  Alice Reichert, MD Primary Gastroenterologist:  Dr. Karilyn Cota  Reason for Consultation:    Melena and anemia.  HPI:   Patient unable to provide detailed history which was obtained from his chart.  Patient is 64 year old Caucasian male with multiple medical problems who presented to emergency room late last night with lightheadedness shortness of breath weakness and syncopal left the scope. Patient has been passing tarry stools for the last few days. Tarry stools started after he was discharged from this facility about 2 weeks ago after having undergone treatment for respiratory failure. Patient was admitted to ICU. His hemoglobin on admission was 8.9 g. is morning it dropped to 7.9 g and subsequently to 7.5 g. Patient was begun on cardiac exam dry for rapid a flutter. His systolic pressure dropped from over 92 low 70s. He is getting ready to receive second unit of PRBCs. He denies chest or abdominal pain. There is no history of peptic ulcer disease. He is on low-dose aspirin but does not take other OTC NSAIDs. Patient has not experienced nausea vomiting or hematemesis. Since patient has been in ICU he has had has had 3 bowel movements in the stools and black burgundy. Patient is on pantoprazole 40 mg IV every 12 hours.    Past Medical History  Diagnosis Date  . Diabetes mellitus without complication   . Hypertension   . Asthma   . CKD (chronic kidney disease)   . AAA (abdominal aortic aneurysm) 2006    Repair  . Aortic valve stenosis 2006    Past Surgical History  Procedure Laterality Date  . Abdominal aortic aneurysm repair  2006    [facility]  . Aortic valve replacement  2006    [name]    Prior to Admission medications   Medication Sig Start Date End Date Taking? Authorizing Provider  apixaban (ELIQUIS) 5 MG TABS tablet Take 1 tablet (5 mg total) by mouth 2 (two) times daily. 05/14/15   Butch Penny,  MD  aspirin EC 81 MG tablet Take 81 mg by mouth daily.    Historical Provider, MD  atorvastatin (LIPITOR) 20 MG tablet Take 1 tablet (20 mg total) by mouth daily at 6 PM. 05/14/15   Angus McInnis, MD  budesonide-formoterol (SYMBICORT) 160-4.5 MCG/ACT inhaler Inhale 2 puffs into the lungs 2 (two) times daily. 05/14/15   Butch Penny, MD  diltiazem (CARDIZEM) 90 MG tablet Take 1 tablet (90 mg total) by mouth 3 (three) times daily. 05/14/15   Butch Penny, MD  HYDROcodone-acetaminophen (NORCO) 7.5-325 MG per tablet Take 1 tablet by mouth every 4 (four) hours as needed for moderate pain or severe pain.  12/10/14   Historical Provider, MD  insulin aspart (NOVOLOG) 100 UNIT/ML FlexPen Inject 20 Units into the skin 3 (three) times daily with meals. Sliding scale    Historical Provider, MD  insulin glargine (LANTUS) 100 unit/mL SOPN Inject 20-28 Units into the skin 2 (two) times daily. 28 units in the morning and 20 units before bed    Historical Provider, MD  levofloxacin (LEVAQUIN) 750 MG tablet Take 1 tablet (750 mg total) by mouth daily. 05/14/15   Angus McInnis, MD  lisinopril (PRINIVIL,ZESTRIL) 10 MG tablet Take 10 mg by mouth daily.    Historical Provider, MD  metoprolol succinate (TOPROL XL) 25 MG 24 hr tablet Take 1 tablet (25 mg total) by mouth 2 (two) times daily. 05/27/15   Laqueta Linden, MD  oxyCODONE-acetaminophen (PERCOCET/ROXICET)  5-325 MG per tablet Take 1 tablet by mouth every 6 (six) hours as needed. 12/18/14   Bethann Berkshire, MD  predniSONE (DELTASONE) 20 MG tablet Take 2 tablets (40 mg total) by mouth daily with breakfast. 05/14/15   Butch Penny, MD  simvastatin (ZOCOR) 40 MG tablet Take 40 mg by mouth daily. 11/15/14   Historical Provider, MD    Current Facility-Administered Medications  Medication Dose Route Frequency Provider Last Rate Last Dose  . 0.9 %  sodium chloride infusion   Intravenous Continuous Ron Parker, MD 75 mL/hr at 05/30/15 0330    . 0.9 %  sodium chloride  infusion   Intravenous Once Harvette Velora Heckler, MD      . 0.9 %  sodium chloride infusion   Intravenous Once Angus McInnis, MD      . 0.9 %  sodium chloride infusion   Intravenous Once Malissa Hippo, MD      . 0.9 %  sodium chloride infusion   Intravenous Once Malissa Hippo, MD      . acetaminophen (TYLENOL) tablet 650 mg  650 mg Oral Q6H PRN Ron Parker, MD       Or  . acetaminophen (TYLENOL) suppository 650 mg  650 mg Rectal Q6H PRN Ron Parker, MD      . alum & mag hydroxide-simeth (MAALOX/MYLANTA) 200-200-20 MG/5ML suspension 30 mL  30 mL Oral Q6H PRN Ron Parker, MD      . atorvastatin (LIPITOR) tablet 20 mg  20 mg Oral q1800 Ron Parker, MD      . diltiazem (CARDIZEM) 100 mg in dextrose 5 % 100 mL (1 mg/mL) infusion  5-15 mg/hr Intravenous Continuous Ron Parker, MD 5 mL/hr at 05/30/15 0646 5 mg/hr at 05/30/15 0646  . diltiazem (CARDIZEM) tablet 90 mg  90 mg Oral TID Ron Parker, MD   90 mg at 05/30/15 1000  . HYDROmorphone (DILAUDID) injection 0.5-1 mg  0.5-1 mg Intravenous Q3H PRN Ron Parker, MD      . insulin glargine (LANTUS) injection 20 Units  20 Units Subcutaneous QHS Angus McInnis, MD      . insulin glargine (LANTUS) injection 28 Units  28 Units Subcutaneous Daily Angus McInnis, MD   28 Units at 05/30/15 1100  . metoprolol succinate (TOPROL-XL) 24 hr tablet 25 mg  25 mg Oral BID Ron Parker, MD   25 mg at 05/30/15 0331  . ondansetron (ZOFRAN) tablet 4 mg  4 mg Oral Q6H PRN Ron Parker, MD       Or  . ondansetron (ZOFRAN) injection 4 mg  4 mg Intravenous Q6H PRN Ron Parker, MD      . oxyCODONE (Oxy IR/ROXICODONE) immediate release tablet 5 mg  5 mg Oral Q4H PRN Ron Parker, MD      . pantoprazole (PROTONIX) 80 mg in sodium chloride 0.9 % 250 mL (0.32 mg/mL) infusion  8 mg/hr Intravenous Continuous Dione Booze, MD 25 mL/hr at 05/30/15 0646 8 mg/hr at 05/30/15 0646  . [START ON 06/02/2015] pantoprazole  (PROTONIX) injection 40 mg  40 mg Intravenous Q12H Dione Booze, MD      . sodium chloride 0.9 % injection 10-40 mL  10-40 mL Intracatheter Q12H Angus McInnis, MD      . sodium chloride 0.9 % injection 10-40 mL  10-40 mL Intracatheter PRN Butch Penny, MD        Allergies as of 06/15/2015 - Review Complete 05/23/2015  Allergen  Reaction Noted  . Penicillins Rash 12/18/2014    Family History  Problem Relation Age of Onset  . Rheum arthritis Mother     Social History   Social History  . Marital Status: Divorced    Spouse Name: N/A  . Number of Children: N/A  . Years of Education: N/A   Occupational History  . Not on file.   Social History Main Topics  . Smoking status: Former Smoker    Types: Cigarettes    Quit date: 10/15/2004  . Smokeless tobacco: Not on file  . Alcohol Use: No  . Drug Use: No  . Sexual Activity: Not Currently   Other Topics Concern  . Not on file   Social History Narrative    Review of Systems: See HPI, otherwise normal ROS  Physical Exam: Temp:  [97.7 F (36.5 C)-98.3 F (36.8 C)] 97.7 F (36.5 C) (08/15 1238) Pulse Rate:  [65-139] 129 (08/15 1245) Resp:  [10-24] 24 (08/15 1245) BP: (71-118)/(51-91) 71/54 mmHg (08/15 1245) SpO2:  [95 %-100 %] 100 % (08/15 1245) FiO2 (%):  [32 %] 32 % (08/15 0531) Weight:  [308 lb (139.708 kg)-312 lb 2.7 oz (141.6 kg)] 312 lb 2.7 oz (141.6 kg) (08/15 0311)   Patient is drowsy but responds appropriately to simple questions. He is diaphoretic. Conjunctiva is pale sclerae nonicteric. Oropharyngeal mucosa is dry. No neck masses or thyromegaly noted. Cardiac exam with regular rhythm normal S1 and S2. No murmur or gallop noted. Lungs are clear to auscultation. Abdomen is these but soft and nontender without organomegaly or masses. Extremities without clubbing or edema.   Lab Results:  Recent Labs  06/06/2015 2355 05/30/15 0222 05/30/15 0345 05/30/15 0857  WBC 14.2*  --  14.1* 17.0*  HGB 8.9* 7.9*  7.5* 7.2*  HCT 26.4* 23.3* 22.0* 21.2*  PLT 139*  --  121* 118*   BMET  Recent Labs  05/28/2015 2355 05/30/15 0345  NA 137 137  K 4.4 4.0  CL 110 113*  CO2 16* 17*  GLUCOSE 179* 185*  BUN 74* 83*  CREATININE 1.81* 1.79*  CALCIUM 8.1* 7.7*    Studies/Results: Dg Chest Port 1 View  05/30/2015   CLINICAL DATA:  Central catheter placement  EXAM: PORTABLE CHEST - 1 VIEW  COMPARISON:  Study obtained earlier in the day  FINDINGS: A portion of the lateral left base is not visualized on this study. Central catheter tip is in the superior vena cava. No pneumothorax is appreciable. The visualized lungs are clear. The heart is upper normal in size with pulmonary vascularity within normal limits. Patient is status post aortic valve replacement. No adenopathy.  IMPRESSION: Note that a portion of the left base is not visualized. Visualize lungs clear. Central catheter tip in superior vena cava. No pneumothorax.   Electronically Signed   By: Bretta Bang III M.D.   On: 05/30/2015 12:01   Dg Chest Port 1 View  05/30/2015   CLINICAL DATA:  Shortness of breath  EXAM: PORTABLE CHEST - 1 VIEW  COMPARISON:  05/09/2015  FINDINGS: Stable cardiomegaly and aortic tortuosity in this patient post aortic valve replacement. There is no edema, consolidation, effusion, or pneumothorax. No osseous findings to explain chest pain.  IMPRESSION: Stable exam.  No evidence of acute cardiopulmonary disease.   Electronically Signed   By: Marnee Spring M.D.   On: 05/30/2015 01:01    Assessment; Acute GI bleed with hemodynamic instability in a patient with multiple co-morbidities. Patient has been on Eliquis  and low-dose aspirin. Given markedly elevated BUN suspect upper GI bleed. Patient is also at risk for peptic ulcer disease as well as reflux esophagitis. He has mild thrombocytopenia which appears to be new and most likely related to acute GI bleed rather than occult chronic liver disease. Endoscopic evaluation would  be delayed until patient is hemodynamically stable. Mildly elevated troponin levels felt to be secondary to demand ischemia. Atrial flutter with rapid ventricular response. Patient is on Cardizem infusion.    Recommendations; Will check INR with next blood draw. Given bolus of 250 mL of normal saline. Transfuse third unit of PRBCs. Foley's catheter for close monitoring of urine output. H&H after third unit of PRBCs transfused. Esophagogastroduodenoscopy either this afternoon or tomorrow morning when patient hemodynamically stable.   LOS: 0 days   Eudell Mcphee U  05/30/2015, 1:00 PM

## 2015-05-30 NOTE — Progress Notes (Signed)
eLink Physician-Brief Progress Note Patient Name: Vincent Kaufman DOB: 06/13/1951 MRN: [id number]   Date of Service  05/30/2015  HPI/Events of Note  Hypotension - BP = 73/33.  eICU Interventions  Will order Norepinephrine.      Intervention Category Major Interventions: Hypotension - evaluation and management  Dalessandro Baldyga Eugene 05/30/2015, 7:52 PM

## 2015-05-30 NOTE — Progress Notes (Signed)
Subjective: The patient's vital signs are stable with exception of tachycardia. He was admitted with history of having been passing black stools for the past 3 weeks his hemoglobin on admission was 7.9 and subsequently 7.5. He is receiving unit of blood he has been on anticoagulation( he did develop lightheadedness and passed out prior to admission for a few minutes. He does have additional problems of atrial flutter hypertension diabetes mellitus CK D  Objective: Vital signs in last 24 hours: Temp:  [97.7 F (36.5 C)-98.3 F (36.8 C)] 98 F (36.7 C) (08/15 0546) Pulse Rate:  [132-139] 137 (08/15 0546) Resp:  [10-22] 17 (08/15 0546) BP: (89-107)/(54-89) 99/74 mmHg (08/15 0546) SpO2:  [99 %-100 %] 100 % (08/15 0546) FiO2 (%):  [32 %] 32 % (08/15 0531) Weight:  [139.708 kg (308 lb)-141.6 kg (312 lb 2.7 oz)] 141.6 kg (312 lb 2.7 oz) (08/15 0311) Weight change:     Intake/Output from previous day: 08/14 0701 - 08/15 0700 In: 30 [Blood:30] Out: -  Intake/Output this shift: Total I/O In: 30 [Blood:30] Out: -   Physical Exam: General appearance patient is alert and oriented currently is having loose black stool  HEENT negative  Neck supple no JVD or thyroid abnormalities  Lungs clear to P&A  Heart regular rhythm  Abdomen no palpable organs or masses  Extremities free of edema   Recent Labs  June 11, 2015 2355 05/30/15 0222 05/30/15 0345  WBC 14.2*  --  14.1*  HGB 8.9* 7.9* 7.5*  HCT 26.4* 23.3* 22.0*  PLT 139*  --  121*   BMET  Recent Labs  Jun 11, 2015 2355 05/30/15 0345  NA 137 137  K 4.4 4.0  CL 110 113*  CO2 16* 17*  GLUCOSE 179* 185*  BUN 74* 83*  CREATININE 1.81* 1.79*  CALCIUM 8.1* 7.7*    Studies/Results: Dg Chest Port 1 View  05/30/2015   CLINICAL DATA:  Shortness of breath  EXAM: PORTABLE CHEST - 1 VIEW  COMPARISON:  05/09/2015  FINDINGS: Stable cardiomegaly and aortic tortuosity in this patient post aortic valve replacement. There is no edema,  consolidation, effusion, or pneumothorax. No osseous findings to explain chest pain.  IMPRESSION: Stable exam.  No evidence of acute cardiopulmonary disease.   Electronically Signed   By: Marnee Spring M.D.   On: 05/30/2015 01:01    Medications:  . sodium chloride   Intravenous Once  . acetaminophen  650 mg Oral Once  . atorvastatin  20 mg Oral q1800  . diltiazem  10 mg Intravenous Once  . diltiazem  90 mg Oral TID  . furosemide  20 mg Intravenous Once  . insulin glargine  20 Units Subcutaneous QHS  . insulin glargine  28 Units Subcutaneous Daily  . metoprolol succinate  25 mg Oral BID  . [START ON 06/02/2015] pantoprazole (PROTONIX) IV  40 mg Intravenous Q12H  . sodium chloride  500 mL Intravenous Once    . sodium chloride 75 mL/hr at 05/30/15 0330  . diltiazem (CARDIZEM) infusion 5 mg/hr (05/30/15 1610)  . pantoprozole (PROTONIX) infusion 8 mg/hr (05/30/15 0145)     Assessment/Plan: 1. Upper gastrointestinal bleeding with tarry stools. Transfuse GI consult  2. Atrial flutter will continue diltiazem and metoprolol obtain cardiology consult  3. Diabetes mellitus without complication-continue Lantus insulin continue when necessary coverage  4. CK D continue to monitor     LOS: 0 days   Riot Waterworth G 05/30/2015, 6:36 AM

## 2015-05-30 NOTE — Progress Notes (Signed)
Called to room by nurse tech and found patient sitting on side of bed trying to ambulate.  Went to assist patient back up in bed when patient went unresponsive, rolled his eyes, and was foaming out of his mouth. Called Code Ruth, emergency team arrived.  See CPR record for details.

## 2015-05-31 ENCOUNTER — Encounter (HOSPITAL_COMMUNITY): Admission: EM | Disposition: E | Payer: Self-pay | Source: Home / Self Care | Attending: Family Medicine

## 2015-05-31 LAB — BLOOD GAS, ARTERIAL
ACID-BASE DEFICIT: 18.9 mmol/L — AB (ref 0.0–2.0)
Bicarbonate: 9.1 mEq/L — ABNORMAL LOW (ref 20.0–24.0)
DRAWN BY: 22223
FIO2: 80
LHR: 30 {breaths}/min
MECHVT: 620 mL
O2 SAT: 93.6 %
PEEP/CPAP: 5 cmH2O
PH ART: 7.079 — AB (ref 7.350–7.450)
PO2 ART: 98.8 mmHg (ref 80.0–100.0)
Patient temperature: 37
TCO2: 9.4 mmol/L (ref 0–100)
pCO2 arterial: 32.2 mmHg — ABNORMAL LOW (ref 35.0–45.0)

## 2015-05-31 LAB — CBC
HEMATOCRIT: 23 % — AB (ref 39.0–52.0)
Hemoglobin: 7.6 g/dL — ABNORMAL LOW (ref 13.0–17.0)
MCH: 30 pg (ref 26.0–34.0)
MCHC: 33 g/dL (ref 30.0–36.0)
MCV: 90.9 fL (ref 78.0–100.0)
PLATELETS: 58 10*3/uL — AB (ref 150–400)
RBC: 2.53 MIL/uL — ABNORMAL LOW (ref 4.22–5.81)
RDW: 15.6 % — AB (ref 11.5–15.5)
WBC: 12 10*3/uL — AB (ref 4.0–10.5)

## 2015-05-31 LAB — BASIC METABOLIC PANEL
Anion gap: 24 — ABNORMAL HIGH (ref 5–15)
BUN: 112 mg/dL — AB (ref 6–20)
CHLORIDE: 115 mmol/L — AB (ref 101–111)
CO2: 13 mmol/L — AB (ref 22–32)
CREATININE: 4.42 mg/dL — AB (ref 0.61–1.24)
Calcium: 6 mg/dL — CL (ref 8.9–10.3)
GFR calc Af Amer: 15 mL/min — ABNORMAL LOW (ref >60)
GFR calc non Af Amer: 13 mL/min — ABNORMAL LOW (ref >60)
GLUCOSE: 118 mg/dL — AB (ref 65–99)
POTASSIUM: 6 mmol/L — AB (ref 3.5–5.1)
Sodium: 152 mmol/L — ABNORMAL HIGH (ref 135–145)

## 2015-05-31 LAB — TROPONIN I: Troponin I: 0.37 ng/mL — ABNORMAL HIGH (ref <0.031)

## 2015-05-31 LAB — PREPARE RBC (CROSSMATCH)

## 2015-05-31 LAB — LACTIC ACID, PLASMA: Lactic Acid, Venous: 11.7 mmol/L (ref 0.5–2.0)

## 2015-05-31 LAB — GLUCOSE, CAPILLARY
GLUCOSE-CAPILLARY: 108 mg/dL — AB (ref 65–99)
GLUCOSE-CAPILLARY: 152 mg/dL — AB (ref 65–99)

## 2015-05-31 LAB — MAGNESIUM: Magnesium: 2.4 mg/dL (ref 1.7–2.4)

## 2015-05-31 SURGERY — EGD (ESOPHAGOGASTRODUODENOSCOPY)
Anesthesia: Moderate Sedation

## 2015-05-31 MED ORDER — PROTHROMBIN COMPLEX CONC HUMAN 500 UNITS IV KIT
2500.0000 [IU] | PACK | INTRAVENOUS | Status: DC
  Filled 2015-05-31: qty 100

## 2015-05-31 MED ORDER — NOREPINEPHRINE BITARTRATE 1 MG/ML IV SOLN
2.0000 ug/min | INTRAVENOUS | Status: DC
  Administered 2015-05-31: 40 ug/min via INTRAVENOUS
  Administered 2015-05-31: 30 ug/min via INTRAVENOUS
  Filled 2015-05-31: qty 16

## 2015-05-31 MED ORDER — SODIUM BICARBONATE 8.4 % IV SOLN
INTRAVENOUS | Status: DC
  Administered 2015-05-31: 04:00:00 via INTRAVENOUS
  Filled 2015-05-31 (×4): qty 150

## 2015-05-31 MED ORDER — SODIUM BICARBONATE 8.4 % IV SOLN
INTRAVENOUS | Status: AC
  Filled 2015-05-31: qty 150

## 2015-05-31 MED ORDER — VITAMIN K1 10 MG/ML IJ SOLN
10.0000 mg | INTRAVENOUS | Status: DC
  Filled 2015-05-31: qty 1

## 2015-05-31 MED ORDER — SODIUM CHLORIDE 0.9 % IV SOLN: Freq: Once | INTRAVENOUS | Status: DC

## 2015-05-31 MED ORDER — VASOPRESSIN 20 UNIT/ML IV SOLN
0.0300 [IU]/min | INTRAVENOUS | Status: DC
  Filled 2015-05-31: qty 2

## 2015-05-31 MED ORDER — NOREPINEPHRINE BITARTRATE 1 MG/ML IV SOLN
INTRAVENOUS | Status: AC
  Filled 2015-05-31: qty 4

## 2015-05-31 MED ORDER — VASOPRESSIN 20 UNIT/ML IV SOLN
INTRAVENOUS | Status: AC
  Filled 2015-05-31: qty 2

## 2015-05-31 MED ORDER — PHENYLEPHRINE HCL 10 MG/ML IJ SOLN
30.0000 ug/min | INTRAMUSCULAR | Status: DC
  Filled 2015-05-31: qty 1

## 2015-05-31 MED ORDER — VITAMIN K1 10 MG/ML IJ SOLN
INTRAMUSCULAR | Status: AC
  Filled 2015-05-31: qty 1

## 2015-05-31 MED ORDER — DEXTROSE 5 % IV SOLN
10.0000 mg | Freq: Once | INTRAVENOUS | Status: DC
  Filled 2015-05-31: qty 1

## 2015-05-31 MED ORDER — PHENYLEPHRINE HCL 10 MG/ML IJ SOLN
INTRAMUSCULAR | Status: AC
  Filled 2015-05-31: qty 1

## 2015-05-31 MED ORDER — NOREPINEPHRINE BITARTRATE 1 MG/ML IV SOLN
INTRAVENOUS | Status: AC
  Filled 2015-05-31: qty 16

## 2015-06-01 LAB — TYPE AND SCREEN
ABO/RH(D): O POS
Antibody Screen: NEGATIVE
UNIT DIVISION: 0
UNIT DIVISION: 0
UNIT DIVISION: 0
UNIT DIVISION: 0
Unit division: 0
Unit division: 0
Unit division: 0
Unit division: 0

## 2015-06-04 MED FILL — Medication: Qty: 2 | Status: AC

## 2015-06-06 LAB — PREPARE FRESH FROZEN PLASMA
UNIT DIVISION: 0
Unit division: 0

## 2015-06-16 NOTE — Progress Notes (Signed)
Dr Karilyn Cota called and updated on the events of the shift and the patient outcome

## 2015-06-16 NOTE — Progress Notes (Signed)
Dr Karilyn Cota called and updated on events of the night with the patient; asked to make sure Dr Renard Matter was aware as well

## 2015-06-16 NOTE — ED Provider Notes (Signed)
On 8/15 I was called to the pts room for a code.  The pt was having agonal breathing but had a bp and pulse.  I intubated the pt with a #8 tube.  Tube placement was confirmed.  Dr. Karilyn Cota and Renard Matter took over care of the pt  Bethann Berkshire, MD 2015/06/10 1214

## 2015-06-16 NOTE — Progress Notes (Signed)
Code lasted for a hour and a half; after several times for regaining a pulse and then losing it again, the brother and sister after speaking with Dr Randol Kern decided to make the patient a DNR; details can be found on code sheets; time of death was called [phone]

## 2015-06-16 NOTE — ED Provider Notes (Signed)
I was called by the [facility] to evaluate a patient in cardiac arrest in the ICU. Upon my arrival, patient in cardiac arrest. Per nursing report, patient had a change in rhythm to bradycardia and then had an asystolic arrest. Patient admitted for GI bleed. Previously today had a respiratory event requiring intubation. No CPR at that time. Lactate greater than 11. He is status post 600 meq of bicarbonate.  Currently being managed by [facility], Dr. Darrick Penna who has requested a physician at the bedside. CPR in progress. Patient given 1 more amp of bicarbonate.  Repeat pulse and rhythm check shows fine V. fib. Patient was shocked initially at 120 J. Second pulse and rhythm check showed continued V. fib and patient was shocked at 200 J.  Third pulses in rhythm check showed V. fib and patient was shocked again at 200 J. Patient given a total of 3 additional epinephrine.  Continuous CPR was performed per ACLS protocol. On final pulse and rhythm check, patient now in sinus tachycardia with peripheral pulses.  Neurologic exam this time is poor with pupils 8 mm and minimally reactive and no purposeful movement.  Dr. Darrick Penna updated and patient care turned over.  Patient's family was updated at the bedside.  CRITICAL CARE Performed by: Shon Baton   Total critical care time: 45 min  Critical care time was exclusive of separately billable procedures and treating other patients.  Critical care was necessary to treat or prevent imminent or life-threatening deterioration.  Critical care was time spent personally by me on the following activities: development of treatment plan with patient and/or surrogate as well as nursing, discussions with consultants, evaluation of patient's response to treatment, examination of patient, obtaining history from patient or surrogate, ordering and performing treatments and interventions, ordering and review of laboratory studies, ordering and review of radiographic studies, pulse  oximetry and re-evaluation of patient's condition.  Cardiopulmonary Resuscitation (CPR) Procedure Note Directed/Performed by: Shon Baton I personally directed ancillary staff and/or performed CPR in an effort to regain return of spontaneous circulation and to maintain cardiac, neuro and systemic perfusion.    Shon Baton, MD 05/21/2015 [phone]

## 2015-06-16 NOTE — Progress Notes (Signed)
Called for order for A-line.Gloriajean Dell attempted 3 times by myself and 2 times by co-worker Knick RRT with no success. Got great blood return but wasn't able to thread catheter into artery. Nurse Hammock in room and is aware of progress.

## 2015-06-16 NOTE — Progress Notes (Signed)
eLink Physician-Brief Progress Note Patient Name: Vincent Kaufman DOB: [birth year] MRN: [id number]   Date of Service  2015-06-19  HPI/Events of Note  On pressors with labile BP - concern for accuracy with cuff pressures.    eICU Interventions  Plan: Insert aline for HD monitoring.     Intervention Category Major Interventions: Hypotension - evaluation and management  Malachi Suderman June 19, 2015, 1:41 AM

## 2015-06-16 NOTE — Progress Notes (Signed)
While on the phone with [facility] discussing change in patient condition; cardiac rhythm changed and patient blocked down into a brady and was then PEA; code called, CPR started; Dr Darrick Penna from [facility] on the screen in teh room; Dr Wilkie Aye came up from the ED to assist with the code; ACLS followed, see code sheets for details; brother at the bedside and sister is on the way to the hospital

## 2015-06-16 NOTE — Discharge Summary (Signed)
Physician Discharge Summary  Vincent Kaufman ZOX:[id number] DOB: January 22, 1951 DOA: 05/30/2015  PCP: Alice Reichert, MD  Admit date: 06/07/2015 Discharge date: 06/01/2015     Discharge Diagnoses:  1. Cardiopulmonary arrest 2. Multiorgan system failure 3. Hemorrhagic shock-upper gastrointestinal bleed hypotension 4. Metabolic acidosis 5. Chronic kidney disease stage III 6. Diabetes mellitus type 2 7. Anemia 8. Atrial flutter 9.   Discharge Condition: Patient expired Disposition: Patient's body released to funeral home  Diet recommendation: Not applicable  Filed Weights   05/28/2015 2321 05/30/15 0311 06-22-2015 0500  Weight: 139.708 kg (308 lb) 141.6 kg (312 lb 2.7 oz) 141.6 kg (312 lb 2.7 oz)    History of present illness: 64 year old white male with history of asthma aortic stenosis hypertension diabetes mellitus type 2 and chronic kidney disease presented to the emergency room with increasing shortness of breath and history of recent syncope. He had noted black stools for the past few weeks. He had been on anticoagulation. He was admitted to stepdown unit. His anticoagulation was held and . Also it was recommended he be transfused as needed.   Hospital Course:  The patient was started on intravenous fluids and IV Protonix the patient's hemoglobin ranged from 7.5-7.9 blood transfusions were begun the patient was seen by gastroenterology service. EGD was recommended when hemodynamically stable. This procedure was delayed because of instability. He was continued on diltiazem infusion and continued on home meds which included insulin glargine. His sugars remain in satisfactory range. At approximately 5:30 PM the nurse tech found. The patient trying to ambulate. He suddenly became unresponsive. CODE BLUE was called. Emergency team arrived. And the patient was intubated. This was done successfully. Ventilator settings were managed by protocol subsequently the patient had critical ABG results  and had to be given IV sodium bicarbonate. ABGs were monitored. Because of hypotension bolus of normal saline was given and Levophed drip was started. At approximately 3 AM the following morning, the patient's cardiac rhythm changed. He blocked down to bradycardia. A code was again called. ED physicians responded, CPR started. ACLS followed. The patient did not respond.  he was determined to be in cardiac arrest. Because of lack of response and because of patient's pupils were fixed and dilated he was pronounced deceased and the code ended. Family members were present and were counseled. Allergies  Allergen Reactions  . Penicillins Rash    The results of significant diagnostics from this hospitalization (including imaging, microbiology, ancillary and laboratory) are listed below for reference.    Significant Diagnostic Studies: Dg Chest 2 View  05/09/2015   CLINICAL DATA:  Shortness of breath.  EXAM: CHEST  2 VIEW  COMPARISON:  May 03, 2015.  FINDINGS: Stable cardiomediastinal silhouette. Sternotomy wires are noted status post cardiac valve repair. Right axillary surgical clips are noted. No pneumothorax or pleural effusion is noted. No acute pulmonary disease is noted.  IMPRESSION: No active cardiopulmonary disease.   Electronically Signed   By: Lupita Raider, M.D.   On: 05/09/2015 10:40   Dg Chest Port 1 View  05/30/2015   CLINICAL DATA:  Code blue, intubated, history of asthma  EXAM: PORTABLE CHEST - 1 VIEW  COMPARISON:  05/20/2015 1146 hours  FINDINGS: Endotracheal tube terminates 4 cm above the carina.  Lungs are essentially clear.  No pleural effusion or pneumothorax.  Cardiomegaly.  Prosthetic aortic valve.  Median sternotomy.  Right arm PICC terminates at the cavoatrial junction.  Enteric tube courses below the diaphragm.  IMPRESSION: Endotracheal tube terminates 4  cm above the carina.  Additional support apparatus as above.   Electronically Signed   By: Charline Bills M.D.   On:  05/30/2015 18:39   Dg Chest Port 1 View  05/30/2015   CLINICAL DATA:  Central catheter placement  EXAM: PORTABLE CHEST - 1 VIEW  COMPARISON:  Study obtained earlier in the day  FINDINGS: A portion of the lateral left base is not visualized on this study. Central catheter tip is in the superior vena cava. No pneumothorax is appreciable. The visualized lungs are clear. The heart is upper normal in size with pulmonary vascularity within normal limits. Patient is status post aortic valve replacement. No adenopathy.  IMPRESSION: Note that a portion of the left base is not visualized. Visualize lungs clear. Central catheter tip in superior vena cava. No pneumothorax.   Electronically Signed   By: Bretta Bang III M.D.   On: 05/30/2015 12:01   Dg Chest Port 1 View  05/30/2015   CLINICAL DATA:  Shortness of breath  EXAM: PORTABLE CHEST - 1 VIEW  COMPARISON:  05/09/2015  FINDINGS: Stable cardiomegaly and aortic tortuosity in this patient post aortic valve replacement. There is no edema, consolidation, effusion, or pneumothorax. No osseous findings to explain chest pain.  IMPRESSION: Stable exam.  No evidence of acute cardiopulmonary disease.   Electronically Signed   By: Marnee Spring M.D.   On: 05/30/2015 01:01   Dg Chest Port 1 View  05/03/2015   CLINICAL DATA:  Shortness of Breath  EXAM: PORTABLE CHEST - 1 VIEW  COMPARISON:  05/26/2012  FINDINGS: Borderline cardiomegaly. Status post median sternotomy. No acute infiltrate or pleural effusion. No pulmonary edema.  IMPRESSION: No active disease. Borderline cardiomegaly. Status post median sternotomy.   Electronically Signed   By: Natasha Mead M.D.   On: 05/03/2015 12:22    Microbiology: Recent Results (from the past 240 hour(s))  MRSA PCR Screening     Status: None   Collection Time: 05/30/15  3:59 AM  Result Value Ref Range Status   MRSA by PCR NEGATIVE NEGATIVE Final     Labs: Basic Metabolic Panel:  Recent Labs Lab 26-Jun-2015 2355  05/30/15 0345 05/30/15 1733 06/07/2015 0330  NA 137 137 137 152*  K 4.4 4.0 4.5 6.0*  CL 110 113* 112* 115*  CO2 16* 17* 17* 13*  GLUCOSE 179* 185* 230* 118*  BUN 74* 83* 96* 112*  CREATININE 1.81* 1.79* 2.10* 4.42*  CALCIUM 8.1* 7.7* 7.4* 6.0*  MG  --   --   --  2.4   Liver Function Tests:  Recent Labs Lab 05/30/15 1733  AST 14*  ALT 24  ALKPHOS 26*  BILITOT 0.4  PROT 3.7*  ALBUMIN 2.1*   No results for input(s): LIPASE, AMYLASE in the last 168 hours. No results for input(s): AMMONIA in the last 168 hours. CBC:  Recent Labs Lab 05/30/15 0345 05/30/15 0857 05/30/15 1733 05/30/15 2009 05/30/15 2322 05/19/2015 0330  WBC 14.1* 17.0* 24.0*  --  19.6* 12.0*  HGB 7.5* 7.2* 8.5*  8.5* 5.9* 8.8* 7.6*  HCT 22.0* 21.2* 26.1*  25.8* 17.3* 26.1* 23.0*  MCV 92.4 91.8 94.2  --  91.3 90.9  PLT 121* 118* 111*  --  71* 58*   Cardiac Enzymes:  Recent Labs Lab 05/30/15 0857 05/30/15 1733 05/30/15 2009 05/30/15 2322 06/10/2015 0330  TROPONINI 0.05* 0.06* 0.06* 0.15* 0.37*   BNP: BNP (last 3 results)  Recent Labs  05/03/15 1203 05/09/15 0908 06-26-15 2350  BNP  220.0* 81.0 82.0    ProBNP (last 3 results) No results for input(s): PROBNP in the last 8760 hours.  CBG:  Recent Labs Lab 05/30/15 1549 05/30/15 1734 05/30/15 2032 06/03/2015 0028 05/26/2015 0353  GLUCAP 309* 282* 205* 152* 108*    Principal Problem:   Upper gastrointestinal bleeding Active Problems:   Diabetes mellitus without complication   CKD (chronic kidney disease)   Atrial flutter   Anemia   Asthma   GI bleeding   Time coordinating discharge: 1 hour Signed:  Butch Penny, MD 06/01/2015, 2:55 PM

## 2015-06-16 NOTE — Progress Notes (Signed)
Orders received from Dr Dellie Catholic for another 1L NS bolus and an additional 2 amps of NaHCO3 to be given stat

## 2015-06-16 NOTE — Progress Notes (Addendum)
Called for code blue , patient coded yesterday during the day where he required intubation, as well another code earlier this am,  lasted for 45 minutes, supervised by ER physician Dr. Wilkie Aye. - Patient with severe metabolic acidosis, multiorgan failure, hemorrhagic shock, patient coded again, ACLS protocol followed, patient  again pulse 2 for a few minutes, to lose pulse again, started on bicarbonate drip, PRBC and FFP, Kcentra was ordered to reverse the requests, already on pressors, discussed with family (brother and sister at bedside, patient  has no children, not married), at this point patient was made DO NOT RESUSCITATE,he  lost pulse at (time of death) 4:36 am, brother and sister were at bedside. Nursing staff will update PCP as D/W staff. Death certificate was filled by me and given to staff. Cause of death: - Cardiopulmonary arrest Due to: Multiorgan failure Due to: Hemorrhagic shock Due to: Upper gastrointestinal bleed Huey Bienenstock MD

## 2015-06-16 NOTE — Progress Notes (Signed)
Dr Karilyn Cota called for results of gastro cult which were positive; asked to be updated if there were any other changes in the patients condition

## 2015-06-16 NOTE — Progress Notes (Signed)
E-link called back with increasing hypotension after a 1L NS bolus given; orders received for Levophed drip and labs to be drawn

## 2015-06-16 NOTE — Progress Notes (Signed)
Critical results of ABG and lactic acid called to E-link to Dr Dellie Catholic; orders received for more NaHCO3 to be given

## 2015-06-16 NOTE — Progress Notes (Signed)
Received call from e-link questioning pulse ox, waveform has been dampened all shift with multiple probe changes being; expressed concern of titration Levophed upwards with such a liable blood pressure; order received form a-line insertion by RT

## 2015-06-16 NOTE — Progress Notes (Signed)
E-link Dr Arsenio Loader called with critical ABG results; orders received for of NaHCO3 to be given IV stat, increase RR on vent to 30, and repeat ABG at 2045

## 2015-06-16 DEATH — deceased

## 2016-03-14 IMAGING — DX DG KNEE COMPLETE 4+V*R*
4 series · 4 of 4 positions shown · non-contrast
Comparison: 11/06/2006 knee MRI

CLINICAL DATA: Right knee pain.  No known injury

EXAM:
RIGHT KNEE - COMPLETE 4+ VIEW

[knee ap]
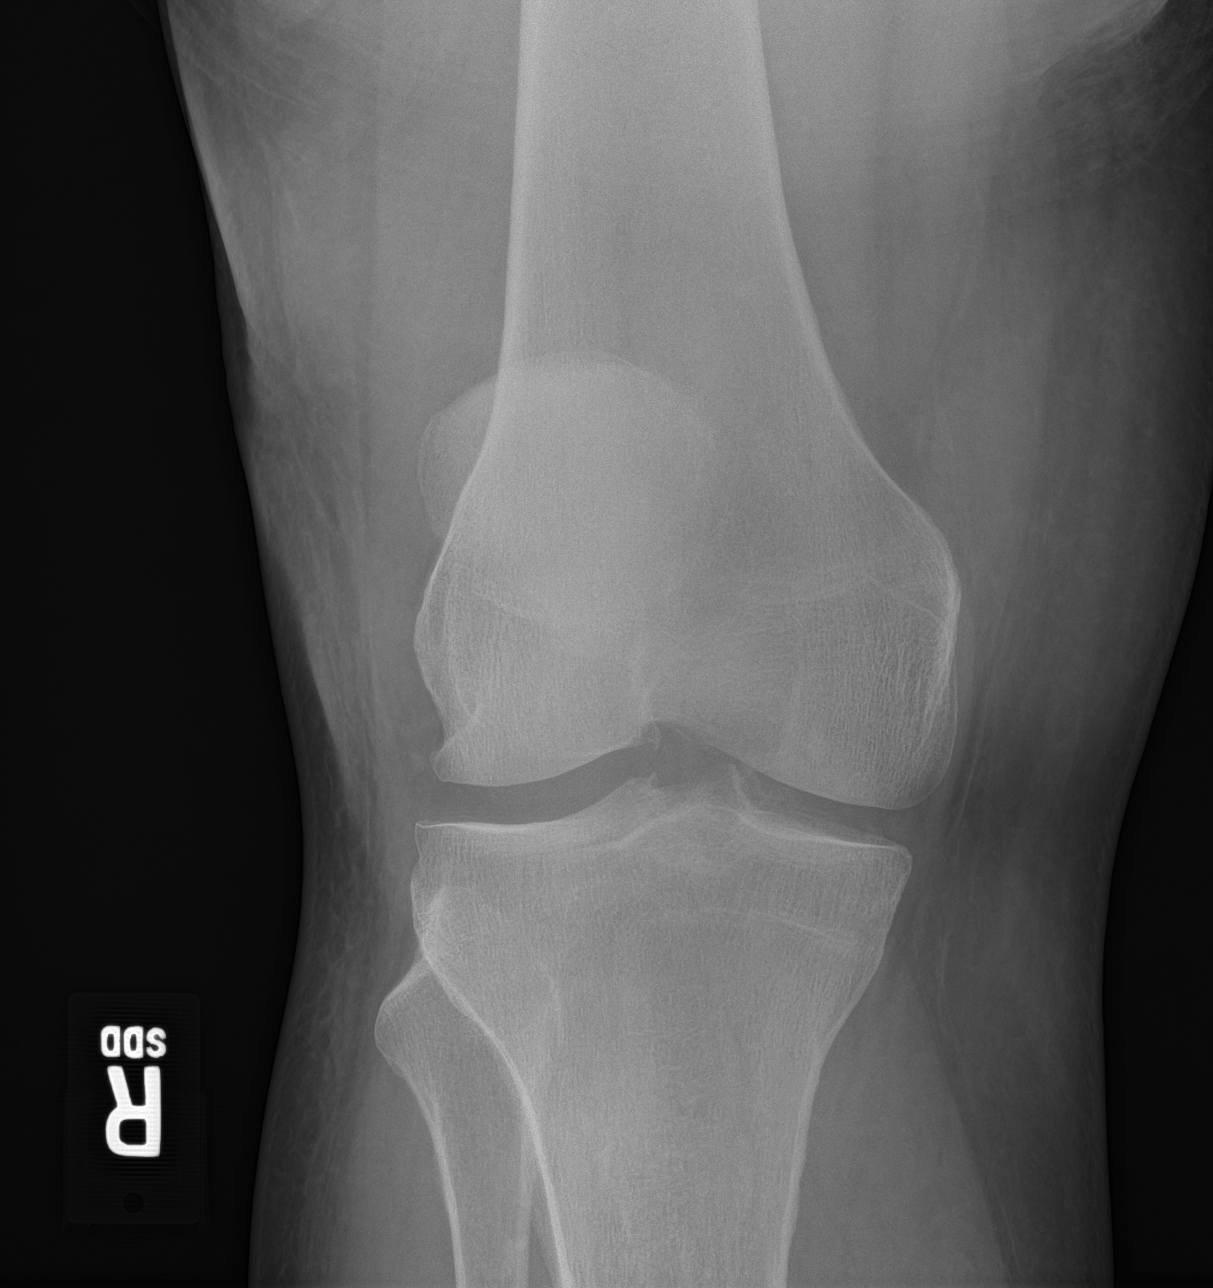

[knee obl (1 of 2)]
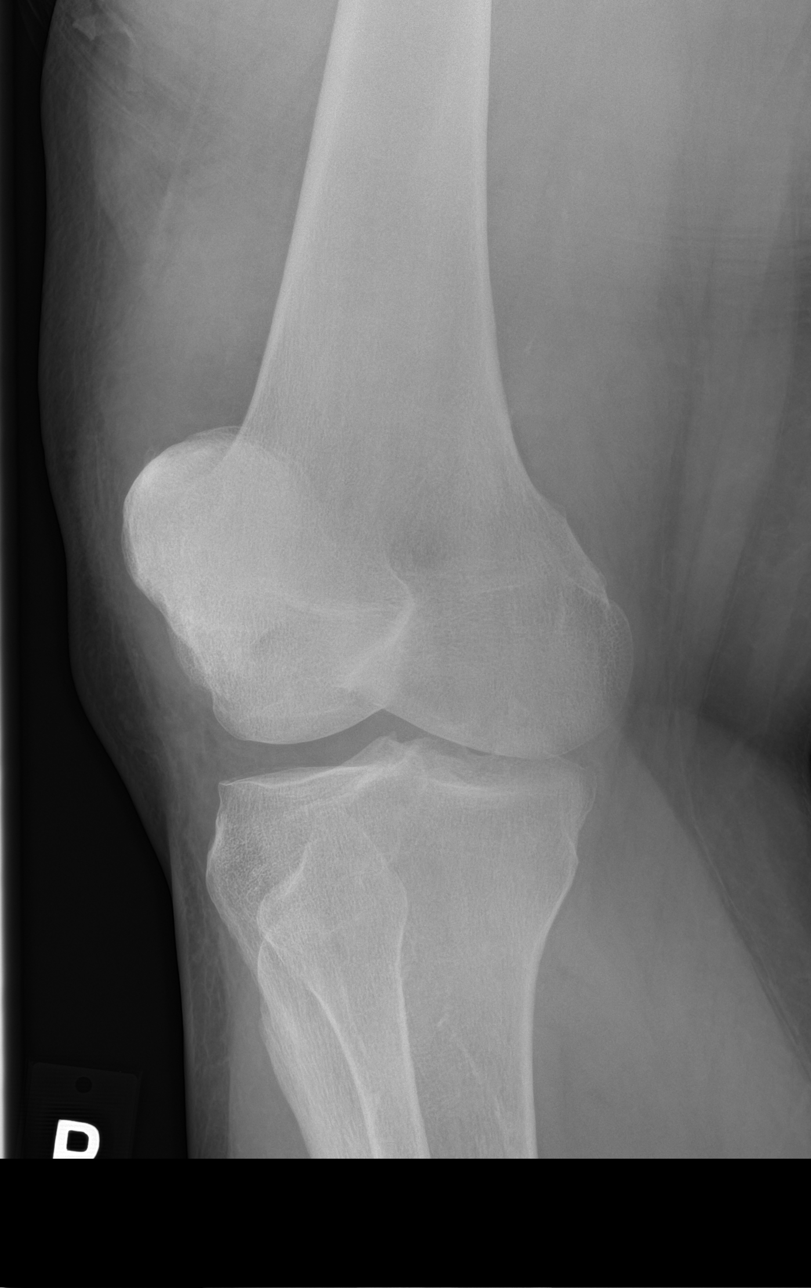

[knee obl (2 of 2)]
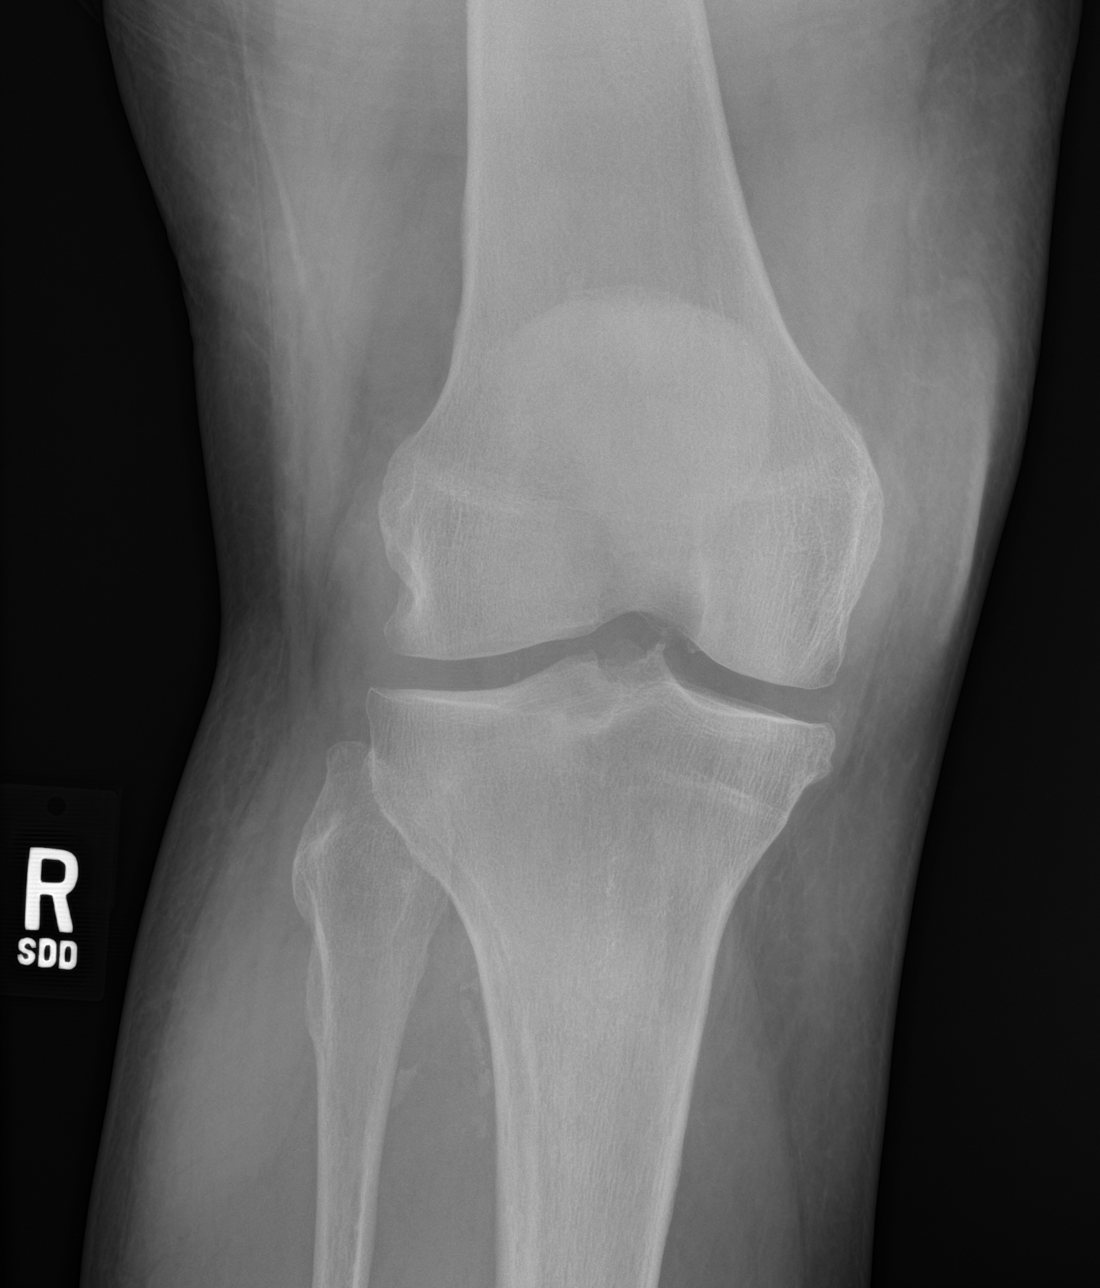

[knee lat]
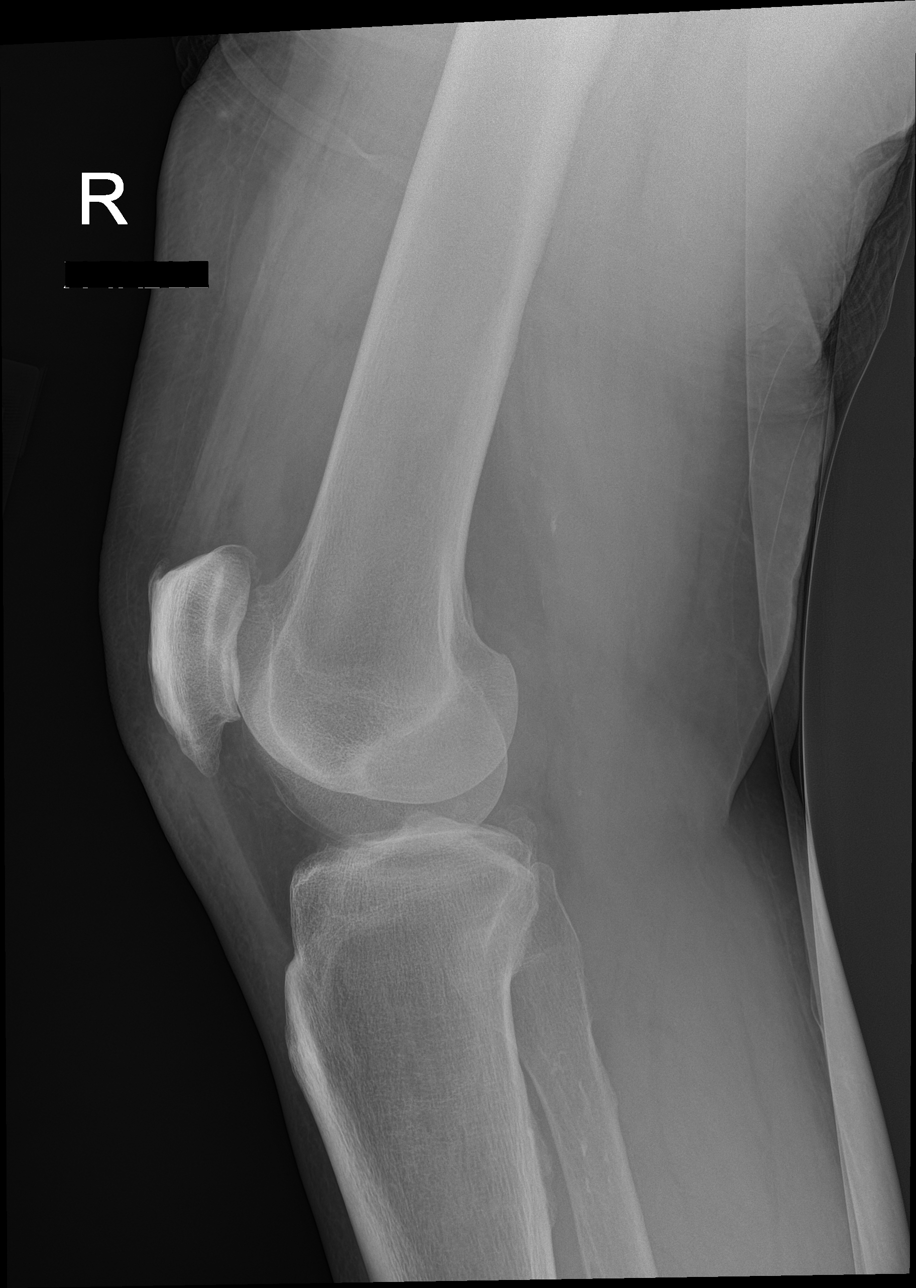

[4 of 4 positions shown; findings below may reference images not displayed]

FINDINGS: There is a small knee joint effusion. Knee osteoarthritis with
marginal spurring about all compartments. There is no notable joint
narrowing. No acute fracture or malalignment.
IMPRESSION: 1. No acute osseous findings.
2. Small knee joint effusion.
3. Mild knee osteoarthritis.

## 2016-07-28 IMAGING — CR DG CHEST 1V PORT
1 series · 1 of 1 positions shown · non-contrast
Comparison: 05/26/2012

CLINICAL DATA: Shortness of Breath

EXAM:
PORTABLE CHEST - 1 VIEW

[ap portable]
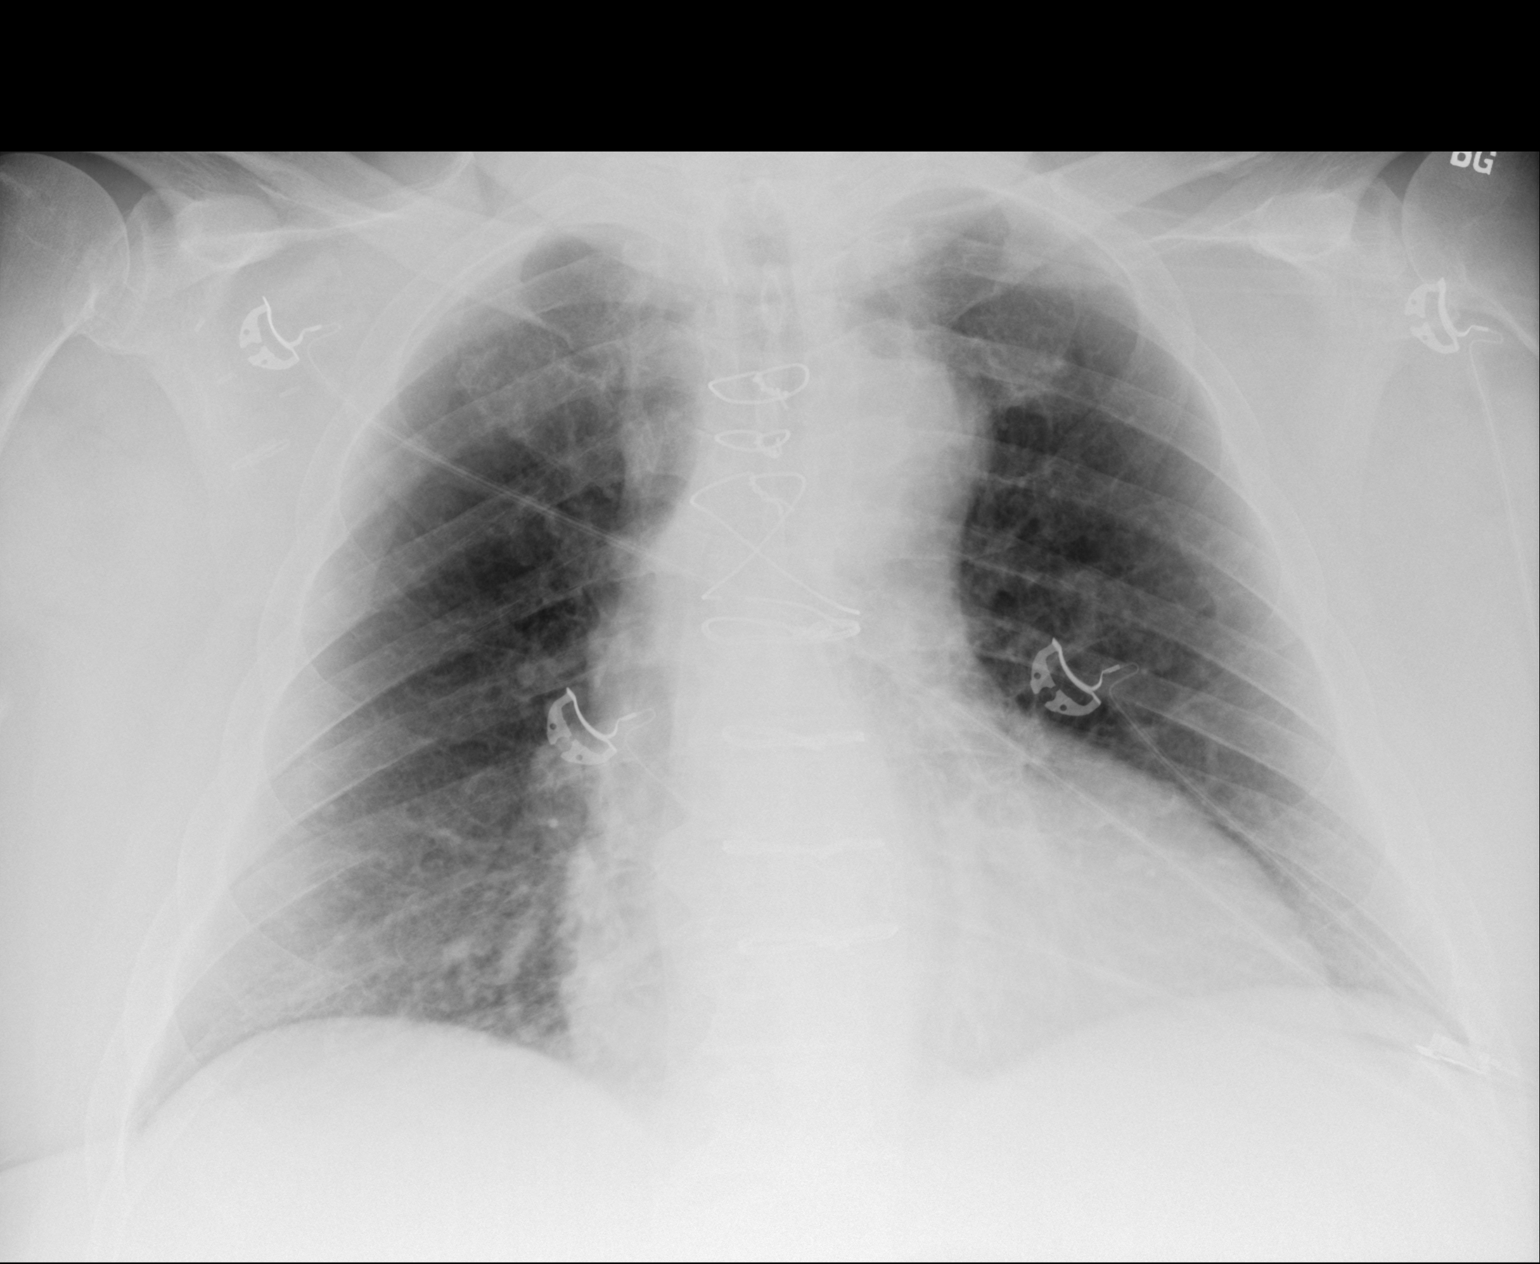

[1 of 1 positions shown; findings below may reference images not displayed]

FINDINGS: Borderline cardiomegaly. Status post median sternotomy. No acute
infiltrate or pleural effusion. No pulmonary edema.
IMPRESSION: No active disease. Borderline cardiomegaly. Status post median
sternotomy.
